# Patient Record
Sex: Male | Born: 1965 | Race: White | Hispanic: No | Marital: Single | Smoking: Former smoker
Health system: Southern US, Community
[De-identification: ages and names within clinical notes are randomized; demographics above are authoritative.]

## PROBLEM LIST (undated history)

## (undated) DIAGNOSIS — Z789 Other specified health status: Secondary | ICD-10-CM

## (undated) HISTORY — PX: KNEE SURGERY: SHX244

## (undated) HISTORY — PX: OTHER SURGICAL HISTORY: SHX169

---

## 2001-04-13 ENCOUNTER — Encounter: Admission: RE | Admit: 2001-04-13 | Discharge: 2001-04-13 | Payer: Self-pay | Admitting: Endocrinology

## 2001-04-13 ENCOUNTER — Encounter: Payer: Self-pay | Admitting: Endocrinology

## 2001-06-15 ENCOUNTER — Ambulatory Visit (HOSPITAL_BASED_OUTPATIENT_CLINIC_OR_DEPARTMENT_OTHER): Admission: RE | Admit: 2001-06-15 | Discharge: 2001-06-15 | Payer: Self-pay | Admitting: Urology

## 2003-10-19 ENCOUNTER — Emergency Department (HOSPITAL_COMMUNITY): Admission: EM | Admit: 2003-10-19 | Discharge: 2003-10-19 | Payer: Self-pay | Admitting: Emergency Medicine

## 2003-10-20 ENCOUNTER — Ambulatory Visit (HOSPITAL_COMMUNITY): Admission: RE | Admit: 2003-10-20 | Discharge: 2003-10-20 | Payer: Self-pay | Admitting: Orthopedic Surgery

## 2005-07-03 ENCOUNTER — Emergency Department (HOSPITAL_COMMUNITY): Admission: EM | Admit: 2005-07-03 | Discharge: 2005-07-03 | Payer: Self-pay | Admitting: Family Medicine

## 2011-07-06 ENCOUNTER — Emergency Department (INDEPENDENT_AMBULATORY_CARE_PROVIDER_SITE_OTHER): Payer: BC Managed Care – PPO

## 2011-07-06 ENCOUNTER — Encounter (HOSPITAL_COMMUNITY): Payer: Self-pay

## 2011-07-06 ENCOUNTER — Emergency Department (INDEPENDENT_AMBULATORY_CARE_PROVIDER_SITE_OTHER)
Admission: EM | Admit: 2011-07-06 | Discharge: 2011-07-06 | Disposition: A | Discharge status: Home / Self Care | Payer: BC Managed Care – PPO | Source: Home / Self Care | Attending: Emergency Medicine | Admitting: Emergency Medicine

## 2011-07-06 DIAGNOSIS — J40 Bronchitis, not specified as acute or chronic: Secondary | ICD-10-CM

## 2011-07-06 MED ORDER — ALBUTEROL SULFATE HFA 108 (90 BASE) MCG/ACT IN AERS: 1.0000 | INHALATION_SPRAY | Freq: Four times a day (QID) | RESPIRATORY_TRACT | Status: DC | PRN

## 2011-07-06 MED ORDER — NAPROXEN 500 MG PO TABS: 500.0000 mg | ORAL_TABLET | Freq: Two times a day (BID) | ORAL | Status: AC

## 2011-07-06 MED ORDER — DEXAMETHASONE 4 MG PO TABS: ORAL_TABLET | ORAL | Status: AC

## 2011-07-06 NOTE — ED Notes (Signed)
Pt has lt sided chest pain that he describes as "stabbing pain when taking a deep breath"

## 2011-07-06 NOTE — Discharge Instructions (Signed)
Take two puffs from your albuterol inhaler every 4 hours. Finish the steroids unless your doctor tells you to stop. u may decrease the frequency of your albuterol inhaler as the numbers go up and you start feeling better. You may take tylenol 1 gram up to 4 times a day as needed for pain. This with 600 mg of motrin is an effective combination for pain and fever. Make sure you drink extra fluids. You need to find a primary care physician for routine health care. Return if you get worse, have a fever >100.4, or any other concerns.   Go to www.goodrx.com to look up your medications. This will give you a list of where you can find your prescriptions at the most affordable prices.

## 2011-07-06 NOTE — ED Provider Notes (Addendum)
History     CSN: [id number]  Arrival date & time 07/06/11  1840   First MD Initiated Contact with Patient 07/06/11 1928      Chief Complaint  Patient presents with  . Pleurisy    (Consider location/radiation/quality/duration/timing/severity/associated sxs/prior treatment) HPI Comments: Patient with sharp, intermittent, nonradiating, left-sided chest pain for the past week. Reports some shortness of breath when he was hiking yesterday. No positional component. No nausea, vomiting, wheezing. No hemoptysis, no unintentional weight loss. No leg pain, swelling, recent prolonged immobilization, surgery in the past 4 weeks, exogenous estrogen, history of DVT or PE, history of cancer. Patient does admit to using  anabolic steroids. States he has been working out extensively for a bodybuilding competition. States that he has had a "cold" for the past 3 weeks. States he used to get pain like this frequently when he was smoking. States he quit several years ago.  ROS as noted in HPI. All other ROS negative.   Patient is a 46 y.o. male presenting with chest pain. The history is provided by the patient. No language interpreter was used.  Chest Pain The chest pain began 5 - 7 days ago. Chest pain occurs intermittently. The chest pain is unchanged. The pain is associated with breathing. The quality of the pain is described as sharp. The pain does not radiate. Primary symptoms include shortness of breath and cough. Pertinent negatives for primary symptoms include no fever, no syncope, no wheezing, no palpitations, no abdominal pain, no nausea and no vomiting.  Pertinent negatives for associated symptoms include no diaphoresis and no lower extremity edema. He tried nothing for the symptoms.  Pertinent negatives for past medical history include no DVT, no hypertension and no PE.     History reviewed. No pertinent past medical history.  History reviewed. No pertinent past surgical history.  History  reviewed. No pertinent family history.  History  Substance Use Topics  . Smoking status: Former Games developer  . Smokeless tobacco: Not on file  . Alcohol Use: No      Review of Systems  Constitutional: Negative for fever and diaphoresis.  Respiratory: Positive for cough and shortness of breath. Negative for wheezing.   Cardiovascular: Positive for chest pain. Negative for palpitations and syncope.  Gastrointestinal: Negative for nausea, vomiting and abdominal pain.    Allergies  Review of patient's allergies indicates no known allergies.  Home Medications  No current outpatient prescriptions on file.  BP 144/82  Pulse 96  Temp(Src) 99.1 F (37.3 C) (Oral)  Resp 18  SpO2 96%  Physical Exam  Nursing note and vitals reviewed. Constitutional: He is oriented to person, place, and time. He appears well-developed and well-nourished.  HENT:  Head: Normocephalic and atraumatic.  Eyes: Conjunctivae and EOM are normal. Pupils are equal, round, and reactive to light.  Neck: Normal range of motion.  Cardiovascular: Normal rate, regular rhythm, normal heart sounds and intact distal pulses.   Pulmonary/Chest: Effort normal and breath sounds normal. No respiratory distress. He has no wheezes. He has no rales. He exhibits no tenderness.  Abdominal: Soft. Bowel sounds are normal. He exhibits no distension. There is no tenderness.  Musculoskeletal: Normal range of motion. He exhibits no edema and no tenderness.  Neurological: He is alert and oriented to person, place, and time.  Skin: Skin is warm and dry. No rash noted.  Psychiatric: He has a normal mood and affect. His behavior is normal.    ED Course  Procedures (including critical care time)  Labs Reviewed - No data to display Dg Chest 2 View  07/06/2011  *RADIOLOGY REPORT*  Clinical Data: Left chest pain with deep inspiration for 1 week. Former smoker.  CHEST - 2 VIEW  Comparison: None.  Findings: The heart size and mediastinal  contours are normal.  The lungs do appear mildly hyperinflated but clear.  There is no pleural effusion or pneumothorax.  No fractures are identified.  IMPRESSION: No acute cardiopulmonary process.  Suspected mild obstructive lung disease.  Original Report Authenticated By: Gerrianne Scale, M.D.     1. Bronchitis    EKG: Normal sinus rhythm, rate 79. Normal axis, normal intervals. No hypertrophy. No ST-T wave changes. No previous EKG for comparison.  MDM  Previous records reviewed, noncontributory. H&P most consistent with a bronchitis.  No evidence of pneumonia on x-ray, or EKG changes suggestive of chest ischemia. Sent home with 2 days of Decadron, albuterol, high-dose NSAIDs. Discussed dangers of using anabolic steroids. Patient states that he's not used any in the past 2 weeks. Advised patient to not restart. Provided referrals for primary care.  Luiz Blare, MD 07/07/11 3086  Luiz Blare, MD 07/07/11 [phone]

## 2011-09-08 ENCOUNTER — Other Ambulatory Visit: Payer: BC Managed Care – PPO | Admitting: Internal Medicine

## 2011-09-09 ENCOUNTER — Encounter: Payer: BC Managed Care – PPO | Admitting: Internal Medicine

## 2011-10-30 ENCOUNTER — Other Ambulatory Visit: Payer: BC Managed Care – PPO | Admitting: Internal Medicine

## 2011-10-31 ENCOUNTER — Encounter: Payer: Self-pay | Admitting: Internal Medicine

## 2011-10-31 ENCOUNTER — Ambulatory Visit (INDEPENDENT_AMBULATORY_CARE_PROVIDER_SITE_OTHER): Payer: BC Managed Care – PPO | Admitting: Internal Medicine

## 2011-10-31 ENCOUNTER — Other Ambulatory Visit: Payer: BC Managed Care – PPO | Admitting: Internal Medicine

## 2011-10-31 VITALS — BP 114/66 | HR 72 | Temp 98.0°F | Ht 70.5 in | Wt 170.5 lb

## 2011-10-31 DIAGNOSIS — Z0000 Encounter for general adult medical examination without abnormal findings: Secondary | ICD-10-CM

## 2011-10-31 DIAGNOSIS — K429 Umbilical hernia without obstruction or gangrene: Secondary | ICD-10-CM

## 2011-10-31 DIAGNOSIS — Z23 Encounter for immunization: Secondary | ICD-10-CM

## 2011-10-31 LAB — CBC WITH DIFFERENTIAL/PLATELET
Basophils Relative: 1 % (ref 0–1)
Eosinophils Absolute: 0.1 10*3/uL (ref 0.0–0.7)
HCT: 44.1 % (ref 39.0–52.0)
Hemoglobin: 14.9 g/dL (ref 13.0–17.0)
Lymphs Abs: 1.8 10*3/uL (ref 0.7–4.0)
MCH: 32 pg (ref 26.0–34.0)
MCHC: 33.8 g/dL (ref 30.0–36.0)
MCV: 94.8 fL (ref 78.0–100.0)
Monocytes Absolute: 0.6 10*3/uL (ref 0.1–1.0)
Monocytes Relative: 12 % (ref 3–12)
Neutrophils Relative %: 52 % (ref 43–77)
RBC: 4.65 MIL/uL (ref 4.22–5.81)

## 2011-10-31 LAB — COMPREHENSIVE METABOLIC PANEL
Albumin: 4 g/dL (ref 3.5–5.2)
BUN: 22 mg/dL (ref 6–23)
CO2: 30 mEq/L (ref 19–32)
Glucose, Bld: 74 mg/dL (ref 70–99)
Sodium: 140 mEq/L (ref 135–145)
Total Bilirubin: 0.5 mg/dL (ref 0.3–1.2)
Total Protein: 6.3 g/dL (ref 6.0–8.3)

## 2011-10-31 LAB — LIPID PANEL
Cholesterol: 156 mg/dL (ref 0–200)
HDL: 49 mg/dL (ref >39)
Triglycerides: 71 mg/dL (ref <150)
VLDL: 14 mg/dL (ref 0–40)

## 2011-10-31 LAB — POCT URINALYSIS DIPSTICK
Bilirubin, UA: NEGATIVE
Blood, UA: NEGATIVE
Glucose, UA: NEGATIVE
Spec Grav, UA: 1.015
pH, UA: 6.5

## 2011-10-31 MED ORDER — TETANUS-DIPHTH-ACELL PERTUSSIS 5-2.5-18.5 LF-MCG/0.5 IM SUSP: 0.5000 mL | Freq: Once | INTRAMUSCULAR | Status: AC

## 2011-11-01 DIAGNOSIS — K429 Umbilical hernia without obstruction or gangrene: Secondary | ICD-10-CM | POA: Insufficient documentation

## 2011-11-01 NOTE — Progress Notes (Signed)
  Subjective:    Patient ID: Austin Burke, male    DOB: [birth year], 46 y.o.   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 He is also training for a physique contest to be held in November at [employer]. No history of serious illnesses, accidents, hospitalizations, operations. No known drug allergies. No chronic medications. Patient not sure when he last had a tetanus immunization so that they was given today. He is divorced. Currently does not smoke. Quit smoking about 6 months ago for the second time. At that time was smoking about a half pack per day and had smoked that amount for 10 years. Prior to that he had smoked for 5 years about the same amount. He does weights and works out.  Social history: Does not smoke alcohol and is divorced with no children  Family history: Father age 3 with history of COPD and hypertension. Mother living age 73 in good health. 2 brothers ages 6 and 77 in good health.    Review of Systems  Constitutional: Negative.   HENT: Negative.   Eyes: Negative.   Respiratory: Negative.   Cardiovascular: Negative.   Gastrointestinal: Negative.   Genitourinary: Negative.   Musculoskeletal: Negative.   Neurological: Negative.   Hematological: Negative.   Psychiatric/Behavioral: Negative.        Objective:   Physical Exam  Nursing note and vitals reviewed. Constitutional: He is oriented to person, place, and time. He appears well-developed and well-nourished. No distress.  HENT:  Head: Normocephalic and atraumatic.  Right Ear: External ear normal.  Left Ear: External ear normal.  Mouth/Throat: Oropharynx is clear and moist. No oropharyngeal exudate.  Eyes: Conjunctivae and EOM are normal. Pupils are equal, round, and reactive to light. Right eye exhibits no discharge. Left eye exhibits no discharge. No scleral icterus.  Neck: Neck supple. No JVD present.    Cardiovascular: Normal rate, regular rhythm, normal heart sounds and intact distal pulses.   No murmur heard. Pulmonary/Chest: Effort normal and breath sounds normal. No respiratory distress. He has no wheezes. He has no rales.  Abdominal: Soft. Bowel sounds are normal. He exhibits no distension and no mass. There is no tenderness. There is no rebound and no guarding.       Small umbilical hernia that is reducible  Genitourinary: Prostate normal and penis normal.       No hernias to direct palpation  Lymphadenopathy:    He has no cervical adenopathy.  Neurological: He is alert and oriented to person, place, and time. He has normal reflexes. No cranial nerve deficit. Coordination normal.  Skin: Skin is warm and dry. He is not diaphoretic.       Tatto right upper arm  Psychiatric: He has a normal mood and affect. His behavior is normal. Judgment and thought content normal.          Assessment & Plan:  Small reducible umbilical hernia  Plan: Fasting lab studies drawn and are pending. Results will be reviewed and mailed to him with comments. I am glad he has quit smoking. Return one year or as needed. Tdap given today. Addendum: 11/01/2011 lab  studies reviewed and are within normal limits.

## 2011-11-01 NOTE — Patient Instructions (Addendum)
Tetanus immunization update given today good for 10 years. Return in one year or as needed.

## 2012-03-24 DIAGNOSIS — K648 Other hemorrhoids: Secondary | ICD-10-CM

## 2012-03-24 HISTORY — DX: Other hemorrhoids: K64.8

## 2012-03-31 ENCOUNTER — Encounter (HOSPITAL_COMMUNITY): Payer: Self-pay | Admitting: *Deleted

## 2012-03-31 ENCOUNTER — Emergency Department (INDEPENDENT_AMBULATORY_CARE_PROVIDER_SITE_OTHER)
Admission: EM | Admit: 2012-03-31 | Discharge: 2012-03-31 | Disposition: A | Discharge status: Home / Self Care | Payer: BC Managed Care – PPO | Source: Home / Self Care | Attending: Emergency Medicine | Admitting: Emergency Medicine

## 2012-03-31 DIAGNOSIS — L03019 Cellulitis of unspecified finger: Secondary | ICD-10-CM

## 2012-03-31 MED ORDER — IBUPROFEN 800 MG PO TABS
ORAL_TABLET | ORAL | Status: AC
Start: 2012-03-31 — End: 2012-03-31
  Filled 2012-03-31: qty 1

## 2012-03-31 MED ORDER — AMOXICILLIN-POT CLAVULANATE 500-125 MG PO TABS: 1.0000 | ORAL_TABLET | Freq: Two times a day (BID) | ORAL | Status: AC

## 2012-03-31 MED ORDER — IBUPROFEN 800 MG PO TABS
800.0000 mg | ORAL_TABLET | Freq: Once | ORAL | Status: AC
  Administered 2012-03-31: 800 mg via ORAL

## 2012-03-31 NOTE — ED Notes (Signed)
Redness, tenderness and swelling around nail of right middle finger onset 2-3 days ago.  Started turning purple today.

## 2012-03-31 NOTE — ED Provider Notes (Signed)
History     CSN: [id number]  Arrival date & time 03/31/12  1514   First MD Initiated Contact with Patient 03/31/12 [phone]      Chief Complaint  Patient presents with  . Wound Infection    (Consider location/radiation/quality/duration/timing/severity/associated sxs/prior treatment) HPI Comments: For the last 2-3 days patient (redness tenderness and swelling around the nail of the right middle finger. Last night he poked at it with a sterile needle and only obtained scars amount of bloody discharge. Today area seemed to be tender at touch better and swollen. Patient denies any injury or trauma to the area. No fevers, chills numbness or tingling distally to the area of infection.  Patient is a 47 y.o. male presenting with hand pain. The history is provided by the patient.  Hand Pain This is a new problem. The problem occurs every several days. The problem has not changed since onset.Exacerbated by: palpation. He has tried nothing for the symptoms.    History reviewed. No pertinent past medical history.  Past Surgical History  Procedure Date  . Knee surgery     left in 1987    Family History  Problem Relation Age of Onset  . COPD Father     History  Substance Use Topics  . Smoking status: Former Smoker    Quit date: 05/02/2011  . Smokeless tobacco: Not on file  . Alcohol Use: No     Comment: rarely      Review of Systems  Constitutional: Negative for chills and fatigue.  Skin: Positive for wound.    Allergies  Review of patient's allergies indicates no known allergies.  Home Medications   Current Outpatient Rx  Name  Route  Sig  Dispense  Refill  . ONE-DAILY MULTI VITAMINS PO TABS   Oral   Take 1 tablet by mouth daily.         . ALBUTEROL SULFATE HFA 108 (90 BASE) MCG/ACT IN AERS   Inhalation   Inhale 1-2 puffs into the lungs every 6 (six) hours as needed for wheezing. Dispense with aerochamber   1 Inhaler   0   . AMOXICILLIN-POT CLAVULANATE 500-125 MG  PO TABS   Oral   Take 1 tablet (500 mg total) by mouth 2 (two) times daily.   14 tablet   0   . NAPROXEN 500 MG PO TABS   Oral   Take 1 tablet (500 mg total) by mouth 2 (two) times daily.   20 tablet   0     BP 155/83  Pulse 80  Temp 98.7 F (37.1 C) (Oral)  Resp 18  SpO2 98%  Physical Exam  Constitutional: He appears well-developed. No distress.  HENT:  Head: Normocephalic.  Musculoskeletal: He exhibits tenderness.       Hands: Neurological: He is alert.  Skin: No rash noted. There is erythema.    ED Course  Procedures (including critical care time)  Labs Reviewed - No data to display No results found.   1. Paronychia of finger       MDM  Mild early R middle finger paronychia. Puncture obtaining bloody discharge. Patient was encouraged to soak affected finger in soapy water several times several cycles. He was also instructed in guided to use antibiotic only if no improvement in the next 24-48 hours. Patient agrees with treatment plan and followup care as discussed.        Jimmie Molly, MD 03/31/12 [phone]

## 2012-09-07 ENCOUNTER — Ambulatory Visit (INDEPENDENT_AMBULATORY_CARE_PROVIDER_SITE_OTHER): Payer: BC Managed Care – PPO | Admitting: Internal Medicine

## 2012-09-07 ENCOUNTER — Telehealth: Payer: Self-pay | Admitting: Internal Medicine

## 2012-09-07 VITALS — BP 130/74 | HR 72 | Wt 172.5 lb

## 2012-09-07 DIAGNOSIS — K648 Other hemorrhoids: Secondary | ICD-10-CM

## 2012-09-07 NOTE — Telephone Encounter (Signed)
See at 2 pm today

## 2012-09-07 NOTE — Telephone Encounter (Signed)
I did not see Dr. Beryle Quant note in time to bring patient in at 2pm today.  Requested to bring patient at 4:15.  Dr. Lenord Fellers ok with that.  LMOM for patient on his contact # to please call me ASAP to confirm appt today at 4:15 as Dr. Lenord Fellers will be going out of town for the remainder of the week.

## 2012-09-19 ENCOUNTER — Encounter: Payer: Self-pay | Admitting: Internal Medicine

## 2012-09-19 DIAGNOSIS — K648 Other hemorrhoids: Secondary | ICD-10-CM | POA: Insufficient documentation

## 2012-09-19 NOTE — Patient Instructions (Addendum)
Use Anusol rectal suppositories twice daily. Call if symptoms persist. Use ProctoCream-HC in perirectal area 4 times a day

## 2012-09-19 NOTE — Progress Notes (Signed)
  Subjective:    Patient ID: Austin Burke, male    DOB: Feb 23, 1966, 47 y.o.   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 Takes no chronic medications. No history of serious illnesses accidents hospitalizations or operations. No known drug allergies. Quit smoking in early 2013. That was the second time he quit. At that time he was smoking about a half pack per day and has smoked for about 10 years. Prior to that he smoked for 5 years about a half pack per day. He does lift weights and works out.  Social history: Does not consume alcohol. He is divorced with no children.  Family history: Father age 4 with history of COPD and hypertension. Mother in good health. 2 brothers in good health.  Patient has been having issues with rectal pressure and pain. No frank bleeding. Feels that he has a recurrence of hemorrhoids. Says he had this same issue several years ago. He's been using some over-the-counter suppositories with very little relief. Denies significant constipation or diarrhea.    Review of Systems history of small umbilical hernia that is reducible. Tattoo right upper arm.     Objective:   Physical Exam Abdomen: No hepatosplenomegaly masses or tenderness. Rectal exam no masses palpable. Appears to have tight anal sphincter. Anoscopy shows internal hemorrhoids that are friable. No significant external hemorrhoids.        Assessment & Plan:  Internal hemorrhoids  Plan: Anusol HC rectal suppositories to use twice daily. May use Proctofoam HC in the perirectal area 4 times a day. Needs to keep stool soft and may need stool softener for several days. Call if not better in one week or sooner if worse.

## 2014-05-05 ENCOUNTER — Emergency Department (HOSPITAL_COMMUNITY)
Admission: EM | Admit: 2014-05-05 | Discharge: 2014-05-05 | Disposition: A | Discharge status: Home / Self Care | Payer: Self-pay | Attending: Emergency Medicine | Admitting: Emergency Medicine

## 2014-05-05 ENCOUNTER — Emergency Department (HOSPITAL_COMMUNITY): Payer: Self-pay

## 2014-05-05 ENCOUNTER — Encounter (HOSPITAL_COMMUNITY): Payer: Self-pay | Admitting: *Deleted

## 2014-05-05 DIAGNOSIS — X58XXXA Exposure to other specified factors, initial encounter: Secondary | ICD-10-CM | POA: Insufficient documentation

## 2014-05-05 DIAGNOSIS — Z79899 Other long term (current) drug therapy: Secondary | ICD-10-CM | POA: Insufficient documentation

## 2014-05-05 DIAGNOSIS — M6620 Spontaneous rupture of extensor tendons, unspecified site: Secondary | ICD-10-CM

## 2014-05-05 DIAGNOSIS — Z87891 Personal history of nicotine dependence: Secondary | ICD-10-CM | POA: Insufficient documentation

## 2014-05-05 DIAGNOSIS — Y998 Other external cause status: Secondary | ICD-10-CM | POA: Insufficient documentation

## 2014-05-05 DIAGNOSIS — M669 Spontaneous rupture of unspecified tendon: Secondary | ICD-10-CM

## 2014-05-05 DIAGNOSIS — Y9389 Activity, other specified: Secondary | ICD-10-CM | POA: Insufficient documentation

## 2014-05-05 DIAGNOSIS — M6628 Spontaneous rupture of extensor tendons, other site: Secondary | ICD-10-CM | POA: Insufficient documentation

## 2014-05-05 DIAGNOSIS — Y9289 Other specified places as the place of occurrence of the external cause: Secondary | ICD-10-CM | POA: Insufficient documentation

## 2014-05-05 NOTE — ED Provider Notes (Signed)
CSN: [id number]     Arrival date & time 05/05/14  2130 History  This chart was scribed for non-physician practitioner, Austin Forth, PA-C, working with Austin Porter, MD, by Austin Burke, ED Scribe. This patient was seen in room TR04C/TR04C and the patient's care was started at 10:06 PM.   Chief Complaint  Patient presents with  . Finger Injury    The history is provided by the patient and medical records. No language interpreter was used.     HPI Comments: Austin Burke is a 49 y.o. male, with no significant medical history, who presents to the Emergency Department complaining of right ring finger injury that occurred PTA. Patient states he felt a pop in his right ring finger as he pushed himself out of bed. He reports the tip of his finger is permanently flexed and states he is unable to straighten it. There is associated cramping pain to the finger. Patient states he tried to twist and turn the tip of his finger in an attempt to get his finger to straighten and "pop" back in place without success. He denies any other injuries.   History reviewed. No pertinent past medical history. Past Surgical History  Procedure Laterality Date  . Knee surgery      left in 1987   Family History  Problem Relation Age of Onset  . COPD Father    History  Substance Use Topics  . Smoking status: Former Smoker    Quit date: 05/02/2011  . Smokeless tobacco: Not on file  . Alcohol Use: No     Comment: rarely    Review of Systems  Constitutional: Negative for fever and chills.  Gastrointestinal: Negative for nausea and vomiting.  Musculoskeletal: Positive for myalgias and arthralgias. Negative for back pain, joint swelling, neck pain and neck stiffness.  Skin: Negative for wound.  Neurological: Negative for weakness and numbness.  Hematological: Does not bruise/bleed easily.  Psychiatric/Behavioral: The patient is not nervous/anxious.   All other systems reviewed and are  negative.     Allergies  Review of patient's allergies indicates no known allergies.  Home Medications   Prior to Admission medications   Medication Sig Start Date End Date Taking? Authorizing Provider  albuterol (PROVENTIL HFA;VENTOLIN HFA) 108 (90 BASE) MCG/ACT inhaler Inhale 1-2 puffs into the lungs every 6 (six) hours as needed for wheezing. Dispense with aerochamber Patient not taking: Reported on 05/05/2014 07/06/11 07/05/12  Austin Gong, MD  Multiple Vitamin (MULTIVITAMIN) tablet Take 1 tablet by mouth daily.    Historical Provider, MD   Triage Vitals: BP 136/69 mmHg  Pulse 95  Temp(Src) 98 F (36.7 C) (Oral)  Resp 18  SpO2 96%  Physical Exam  Constitutional: He appears well-developed and well-nourished. No distress.  HENT:  Head: Normocephalic and atraumatic.  Eyes: Conjunctivae are normal.  Neck: Normal range of motion.  Cardiovascular: Normal rate, regular rhythm and intact distal pulses.   Capillary refill < 3 sec  Pulmonary/Chest: Effort normal and breath sounds normal.  Musculoskeletal: He exhibits tenderness. He exhibits no edema.  ROM: Full ROM of the MCP and PIP of the right ring finger. Full flexion, but no extension of the DIP of the right ring finger. Right ring finger is in permanent flexion at the DIP. No TTP of the right fingers or hand. Full passive ROM of the MCP, PIP, and DIP of the right ring finger. Positive Elson's test of the right ring finger  Neurological: He is alert. Coordination normal.  Sensation intact  to dull and sharp throughout the entirety of the right hand. Strength 5/5 strength at the MCP and PIP, 5/5 strength with flexion at the DIP, and 0/5 with extension of the DIP.  Skin: Skin is warm and dry. He is not diaphoretic.  No tenting of the skin  Psychiatric: He has a normal mood and affect.  Nursing note and vitals reviewed.   ED Course  Procedures (including critical care time)  DIAGNOSTIC STUDIES: Oxygen Saturation is 96%  on room air, adequate by my interpretation.    COORDINATION OF CARE: At 2210 Discussed treatment plan with patient which includes imaging. Patient agrees.   Labs Review Labs Reviewed - No data to display  Imaging Review Dg Finger Ring Right  05/05/2014   CLINICAL DATA:  FINGER INJURY with pain.   Initial encounter.  EXAM: RIGHT RING FINGER 2+V  COMPARISON:  None.  FINDINGS: 3 views right ring finger. There is no evidence of fracture or dislocation. There is no evidence of arthropathy or other focal bone abnormality. Soft tissues are unremarkable.  IMPRESSION: Negative.   Electronically Signed   By: Odessa Burke  Burke M.D.   On: 05/05/2014 22:51     EKG Interpretation None      MDM   Final diagnoses:  Nontraumatic extensor tendon rupture   Austin ShutterBrian P Burke presents with right ringer finger flexion of the DIP.  Exam with positive Ellison's test and is consistent with right extensor tendon rupture.  No laceration to the hand. Strength 5/5 in all other joints of the right hand. Sensation intact throughout.  X-ray without evidence of joint dislocation. Finger splinted in full extension and patient will be referred to hand surgery for repair.  I have personally reviewed patient's vitals, nursing note and any pertinent labs or imaging.  I performed an focused physical exam; undressed when appropriate .    It has been determined that no acute conditions requiring further emergency intervention are present at this time. The patient/guardian have been advised of the diagnosis and plan. I reviewed any labs and imaging including any potential incidental findings. We have discussed signs and symptoms that warrant return to the ED and they are listed in the discharge instructions.    Vital signs are stable at discharge.   BP 139/79 mmHg  Pulse 82  Temp(Src) 98.2 F (36.8 C) (Oral)  Resp 15  SpO2 97%  I personally performed the services described in this documentation, which was scribed in my presence.  The recorded information has been reviewed and is accurate.   Austin ClientHannah Mikyle Sox, PA-C 05/05/14 2322  Austin PorterMark James, MD 05/10/14 [phone]

## 2014-05-05 NOTE — Discharge Instructions (Signed)
1. Medications: ibuprofen as needed for pain, usual home medications 2. Treatment: rest, drink plenty of fluids, wear splint until seen by Hand surgery 3. Follow Up: Please followup with your primary doctor in 3 days for discussion of your diagnoses and further evaluation after today's visit; if you do not have a primary care doctor use the resource guide provided to find one; Please return to the ER for numbness or worsening symptoms    Tendon Injury Tendons are strong, cordlike structures that connect muscle to bone. Tendons are made up of woven fibers, like a rope. A tendon injury is a tear (rupture) of the tendon. The rupture may be partial (only a few of the fibers in your tendon rupture) or complete (your entire tendon ruptures). CAUSES  Tendon injuries can be caused by high-stress activities, such as sports. They also can be caused by a repetitive injury or by a single injury from an excessive, rapid force. SYMPTOMS  Symptoms of tendon injury include pain when you move the joint close to the tendon. Other symptoms are swelling, redness, and warmth. DIAGNOSIS  Tendon injuries often can be diagnosed by physical exam. However, sometimes an X-ray exam or advanced imaging, such as magnetic resonance imaging (MRI), is necessary to determine the extent of the injury. TREATMENT  Partial tendon ruptures often can be treated with immobilization. A splint, bandage, or removable brace usually is used to immobilize the injured tendon. Most injured tendons need to be immobilized for 1-2 months before they are completely healed. Complete tendon ruptures may require surgical reattachment. Document Released: 04/17/2004 Document Revised: 02/27/2011 Document Reviewed: 06/01/2011 [facility] Patient Information 2015 [employer], [place]. This information is not intended to replace advice given to you by your health care provider. Make sure you discuss any questions you have with your health care provider.

## 2014-05-05 NOTE — ED Notes (Signed)
Pt to radiology.

## 2014-05-05 NOTE — ED Notes (Signed)
The pt has a rt  Distal tip of his ring finger that is flexed.  He was getting out of bed pressed his hand on the bed.  He can straighten the finger but it flkexes back again

## 2018-05-13 ENCOUNTER — Encounter: Payer: Self-pay | Admitting: Internal Medicine

## 2018-05-13 ENCOUNTER — Ambulatory Visit (INDEPENDENT_AMBULATORY_CARE_PROVIDER_SITE_OTHER): Payer: 59 | Admitting: Internal Medicine

## 2018-05-13 VITALS — BP 120/90 | HR 90 | Temp 98.5°F | Ht 70.5 in | Wt 158.0 lb

## 2018-05-13 DIAGNOSIS — J029 Acute pharyngitis, unspecified: Secondary | ICD-10-CM

## 2018-05-13 LAB — POCT RAPID STREP A (OFFICE): RAPID STREP A SCREEN: NEGATIVE

## 2018-05-13 MED ORDER — AMOXICILLIN 500 MG PO CAPS: 500.0000 mg | ORAL_CAPSULE | Freq: Three times a day (TID) | ORAL | 0 refills | Status: DC

## 2018-05-13 NOTE — Patient Instructions (Addendum)
Amoxicillin 500 mg 3 times a day x 10 days. Advil for pain. Call if not better in 5-7 days or sooner if worse

## 2018-05-13 NOTE — Progress Notes (Signed)
   Subjective:    Patient ID: Austin Burke, male    DOB: 01-May-1965, 53 y.o.   MRN: [id number]  HPI 53 year old Male not seen since 2014.  Came to office without an appointment.  We worked him and today.  Has headache and sore throat but no other symptoms. Has malaise and fatigue. No fever ot chills.  Says his health is been good over the past few years.  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  Review of Systems see above     Objective:   Physical Exam Vitals signs reviewed.  Constitutional:      Appearance: He is well-developed.  HENT:     Head: Normocephalic and atraumatic.     Right Ear: Tympanic membrane and ear canal normal. No middle ear effusion. Tympanic membrane is not erythematous.     Left Ear: Tympanic membrane and ear canal normal.  No middle ear effusion. Tympanic membrane is not erythematous.     Nose: No rhinorrhea.     Mouth/Throat:     Mouth: Mucous membranes are moist.     Pharynx: No oropharyngeal exudate.     Tonsils: No tonsillar exudate or tonsillar abscesses.     Comments: Pharynx very slightly injected Eyes:     Conjunctiva/sclera: Conjunctivae normal.  Neck:     Musculoskeletal: Neck supple.     Thyroid: No thyromegaly.  Pulmonary:     Effort: Pulmonary effort is normal.     Breath sounds: Normal breath sounds. No wheezing or rales.  Lymphadenopathy:     Cervical: No cervical adenopathy.  Skin:    General: Skin is warm and dry.  Neurological:     Mental Status: He is alert.  Psychiatric:        Mood and Affect: Mood normal.           Assessment & Plan:  Acute pharyngitis-rapid strep screen is negative  Myalgias and headache   Plan: Amoxicillin 500 mg 3 times a day x 10 days. Tylenol or Advil for pain.  Rest and drink plenty of fluids.

## 2019-08-18 ENCOUNTER — Observation Stay (HOSPITAL_BASED_OUTPATIENT_CLINIC_OR_DEPARTMENT_OTHER)
Admission: EM | Admit: 2019-08-18 | Discharge: 2019-08-19 | Disposition: A | Discharge status: Home / Self Care | Payer: 59 | Attending: Surgery | Admitting: Surgery

## 2019-08-18 ENCOUNTER — Emergency Department (HOSPITAL_BASED_OUTPATIENT_CLINIC_OR_DEPARTMENT_OTHER): Payer: 59

## 2019-08-18 ENCOUNTER — Encounter (HOSPITAL_BASED_OUTPATIENT_CLINIC_OR_DEPARTMENT_OTHER): Payer: Self-pay

## 2019-08-18 ENCOUNTER — Emergency Department (HOSPITAL_COMMUNITY): Payer: 59 | Admitting: Registered Nurse

## 2019-08-18 ENCOUNTER — Ambulatory Visit (INDEPENDENT_AMBULATORY_CARE_PROVIDER_SITE_OTHER)
Admission: EM | Admit: 2019-08-18 | Discharge: 2019-08-18 | Disposition: A | Discharge status: Home / Self Care | Payer: 59 | Source: Home / Self Care

## 2019-08-18 ENCOUNTER — Encounter (HOSPITAL_COMMUNITY)
Admission: EM | Disposition: A | Discharge status: Home / Self Care | Payer: Self-pay | Source: Home / Self Care | Attending: Emergency Medicine

## 2019-08-18 ENCOUNTER — Other Ambulatory Visit: Payer: Self-pay

## 2019-08-18 ENCOUNTER — Encounter: Payer: Self-pay | Admitting: Emergency Medicine

## 2019-08-18 DIAGNOSIS — Z87891 Personal history of nicotine dependence: Secondary | ICD-10-CM | POA: Diagnosis not present

## 2019-08-18 DIAGNOSIS — R1011 Right upper quadrant pain: Secondary | ICD-10-CM

## 2019-08-18 DIAGNOSIS — K353 Acute appendicitis with localized peritonitis, without perforation or gangrene: Principal | ICD-10-CM | POA: Insufficient documentation

## 2019-08-18 DIAGNOSIS — R1031 Right lower quadrant pain: Secondary | ICD-10-CM | POA: Insufficient documentation

## 2019-08-18 DIAGNOSIS — Z20822 Contact with and (suspected) exposure to covid-19: Secondary | ICD-10-CM | POA: Insufficient documentation

## 2019-08-18 DIAGNOSIS — K37 Unspecified appendicitis: Secondary | ICD-10-CM | POA: Diagnosis present

## 2019-08-18 HISTORY — PX: LAPAROSCOPIC APPENDECTOMY: SHX408

## 2019-08-18 HISTORY — DX: Other specified health status: Z78.9

## 2019-08-18 LAB — COMPREHENSIVE METABOLIC PANEL
ALT: 15 U/L (ref 0–44)
AST: 15 U/L (ref 15–41)
Albumin: 4.8 g/dL (ref 3.5–5.0)
Alkaline Phosphatase: 95 U/L (ref 38–126)
Anion gap: 11 (ref 5–15)
BUN: 9 mg/dL (ref 6–20)
CO2: 25 mmol/L (ref 22–32)
Calcium: 9.2 mg/dL (ref 8.9–10.3)
Chloride: 100 mmol/L (ref 98–111)
Creatinine, Ser: 0.92 mg/dL (ref 0.61–1.24)
GFR calc Af Amer: >60 mL/min (ref >60)
GFR calc non Af Amer: >60 mL/min (ref >60)
Glucose, Bld: 114 mg/dL — ABNORMAL HIGH (ref 70–99)
Potassium: 3.9 mmol/L (ref 3.5–5.1)
Sodium: 136 mmol/L (ref 135–145)
Total Bilirubin: 0.9 mg/dL (ref 0.3–1.2)
Total Protein: 7.9 g/dL (ref 6.5–8.1)

## 2019-08-18 LAB — CBC WITH DIFFERENTIAL/PLATELET
Abs Immature Granulocytes: 0.07 10*3/uL (ref 0.00–0.07)
Basophils Absolute: 0.1 10*3/uL (ref 0.0–0.1)
Basophils Relative: 0 %
Eosinophils Absolute: 0 10*3/uL (ref 0.0–0.5)
Eosinophils Relative: 0 %
HCT: 45.8 % (ref 39.0–52.0)
Hemoglobin: 16 g/dL (ref 13.0–17.0)
Immature Granulocytes: 1 %
Lymphocytes Relative: 7 %
Lymphs Abs: 0.9 10*3/uL (ref 0.7–4.0)
MCH: 34.6 pg — ABNORMAL HIGH (ref 26.0–34.0)
MCHC: 34.9 g/dL (ref 30.0–36.0)
MCV: 98.9 fL (ref 80.0–100.0)
Monocytes Absolute: 0.6 10*3/uL (ref 0.1–1.0)
Monocytes Relative: 4 %
Neutro Abs: 12.3 10*3/uL — ABNORMAL HIGH (ref 1.7–7.7)
Neutrophils Relative %: 88 %
Platelets: 266 10*3/uL (ref 150–400)
RBC: 4.63 MIL/uL (ref 4.22–5.81)
RDW: 10.9 % — ABNORMAL LOW (ref 11.5–15.5)
WBC: 13.9 10*3/uL — ABNORMAL HIGH (ref 4.0–10.5)
nRBC: 0 % (ref 0.0–0.2)

## 2019-08-18 LAB — URINALYSIS, ROUTINE W REFLEX MICROSCOPIC
Bilirubin Urine: NEGATIVE
Glucose, UA: NEGATIVE mg/dL
Hgb urine dipstick: NEGATIVE
Ketones, ur: NEGATIVE mg/dL
Leukocytes,Ua: NEGATIVE
Nitrite: NEGATIVE
Protein, ur: NEGATIVE mg/dL
Specific Gravity, Urine: 1.025 (ref 1.005–1.030)
pH: 6 (ref 5.0–8.0)

## 2019-08-18 LAB — SARS CORONAVIRUS 2 BY RT PCR (HOSPITAL ORDER, PERFORMED IN ~~LOC~~ HOSPITAL LAB): SARS Coronavirus 2: NEGATIVE

## 2019-08-18 LAB — LIPASE, BLOOD: Lipase: 26 U/L (ref 11–51)

## 2019-08-18 SURGERY — APPENDECTOMY, LAPAROSCOPIC
Anesthesia: General

## 2019-08-18 MED ORDER — LACTATED RINGERS IR SOLN
Status: DC | PRN
  Administered 2019-08-18: 1000 mL

## 2019-08-18 MED ORDER — HYDROCODONE-ACETAMINOPHEN 5-325 MG PO TABS: 1.0000 | ORAL_TABLET | ORAL | Status: DC | PRN

## 2019-08-18 MED ORDER — IOHEXOL 300 MG/ML  SOLN
100.0000 mL | Freq: Once | INTRAMUSCULAR | Status: AC | PRN
  Administered 2019-08-18: 100 mL via INTRAVENOUS

## 2019-08-18 MED ORDER — MIDAZOLAM HCL 2 MG/2ML IJ SOLN
INTRAMUSCULAR | Status: AC
  Filled 2019-08-18: qty 2

## 2019-08-18 MED ORDER — ONDANSETRON 4 MG PO TBDP: 4.0000 mg | ORAL_TABLET | Freq: Four times a day (QID) | ORAL | Status: DC | PRN

## 2019-08-18 MED ORDER — ONDANSETRON HCL 4 MG/2ML IJ SOLN: 4.0000 mg | Freq: Four times a day (QID) | INTRAMUSCULAR | Status: DC | PRN

## 2019-08-18 MED ORDER — METRONIDAZOLE IN NACL 5-0.79 MG/ML-% IV SOLN
500.0000 mg | Freq: Once | INTRAVENOUS | Status: AC
  Administered 2019-08-18: 500 mg via INTRAVENOUS
  Filled 2019-08-18: qty 100

## 2019-08-18 MED ORDER — ONDANSETRON HCL 4 MG/2ML IJ SOLN
4.0000 mg | Freq: Once | INTRAMUSCULAR | Status: AC
  Administered 2019-08-18: 4 mg via INTRAVENOUS
  Filled 2019-08-18: qty 2

## 2019-08-18 MED ORDER — HYDROMORPHONE HCL 1 MG/ML IJ SOLN
1.0000 mg | Freq: Once | INTRAMUSCULAR | Status: AC
  Administered 2019-08-18: 1 mg via INTRAVENOUS
  Filled 2019-08-18: qty 1

## 2019-08-18 MED ORDER — PANTOPRAZOLE SODIUM 40 MG IV SOLR
40.0000 mg | Freq: Every day | INTRAVENOUS | Status: DC
  Administered 2019-08-18: 40 mg via INTRAVENOUS
  Filled 2019-08-18: qty 40

## 2019-08-18 MED ORDER — FAMOTIDINE IN NACL 20-0.9 MG/50ML-% IV SOLN
20.0000 mg | Freq: Once | INTRAVENOUS | Status: AC
  Administered 2019-08-18: 20 mg via INTRAVENOUS
  Filled 2019-08-18: qty 50

## 2019-08-18 MED ORDER — FENTANYL CITRATE (PF) 250 MCG/5ML IJ SOLN
INTRAMUSCULAR | Status: AC
  Filled 2019-08-18: qty 5

## 2019-08-18 MED ORDER — ACETAMINOPHEN 10 MG/ML IV SOLN: 1000.0000 mg | Freq: Once | INTRAVENOUS | Status: DC | PRN

## 2019-08-18 MED ORDER — BUPIVACAINE HCL (PF) 0.25 % IJ SOLN
INTRAMUSCULAR | Status: DC | PRN
  Administered 2019-08-18: 30 mL

## 2019-08-18 MED ORDER — LACTATED RINGERS IV SOLN
INTRAVENOUS | Status: DC | PRN
Start: 2019-08-18 — End: 2019-08-18

## 2019-08-18 MED ORDER — MIDAZOLAM HCL 5 MG/5ML IJ SOLN
INTRAMUSCULAR | Status: DC | PRN
  Administered 2019-08-18: 2 mg via INTRAVENOUS

## 2019-08-18 MED ORDER — KCL IN DEXTROSE-NACL 20-5-0.9 MEQ/L-%-% IV SOLN
INTRAVENOUS | Status: DC
  Filled 2019-08-18: qty 1000

## 2019-08-18 MED ORDER — LIDOCAINE VISCOUS HCL 2 % MT SOLN
15.0000 mL | Freq: Once | OROMUCOSAL | Status: AC
  Administered 2019-08-18: 15 mL via ORAL
  Filled 2019-08-18: qty 15

## 2019-08-18 MED ORDER — SODIUM CHLORIDE 0.9 % IV SOLN
INTRAVENOUS | Status: DC | PRN
  Administered 2019-08-18: 250 mL via INTRAVENOUS

## 2019-08-18 MED ORDER — MORPHINE SULFATE (PF) 4 MG/ML IV SOLN
4.0000 mg | Freq: Once | INTRAVENOUS | Status: AC
  Administered 2019-08-18: 4 mg via INTRAVENOUS
  Filled 2019-08-18: qty 1

## 2019-08-18 MED ORDER — FENTANYL CITRATE (PF) 100 MCG/2ML IJ SOLN: 25.0000 ug | INTRAMUSCULAR | Status: DC | PRN

## 2019-08-18 MED ORDER — ONDANSETRON HCL 4 MG/2ML IJ SOLN
INTRAMUSCULAR | Status: AC
  Filled 2019-08-18: qty 4

## 2019-08-18 MED ORDER — OXYCODONE HCL 5 MG PO TABS: 5.0000 mg | ORAL_TABLET | Freq: Once | ORAL | Status: DC | PRN

## 2019-08-18 MED ORDER — METHOCARBAMOL 500 MG PO TABS
500.0000 mg | ORAL_TABLET | Freq: Four times a day (QID) | ORAL | Status: DC | PRN
  Administered 2019-08-19: 500 mg via ORAL
  Filled 2019-08-18: qty 1

## 2019-08-18 MED ORDER — PROPOFOL 10 MG/ML IV BOLUS
INTRAVENOUS | Status: DC | PRN
  Administered 2019-08-18: 140 mg via INTRAVENOUS
  Administered 2019-08-18: 20 mg via INTRAVENOUS

## 2019-08-18 MED ORDER — PANTOPRAZOLE SODIUM 40 MG IV SOLR
40.0000 mg | Freq: Once | INTRAVENOUS | Status: AC
  Administered 2019-08-18: 40 mg via INTRAVENOUS
  Filled 2019-08-18: qty 40

## 2019-08-18 MED ORDER — PROMETHAZINE HCL 25 MG/ML IJ SOLN: 6.2500 mg | INTRAMUSCULAR | Status: DC | PRN

## 2019-08-18 MED ORDER — FENTANYL CITRATE (PF) 100 MCG/2ML IJ SOLN
INTRAMUSCULAR | Status: DC | PRN
  Administered 2019-08-18: 100 ug via INTRAVENOUS
  Administered 2019-08-18 (×3): 50 ug via INTRAVENOUS

## 2019-08-18 MED ORDER — SODIUM CHLORIDE 0.9 % IV SOLN
2.0000 g | Freq: Once | INTRAVENOUS | Status: AC
  Administered 2019-08-18: 2 g via INTRAVENOUS
  Filled 2019-08-18: qty 20

## 2019-08-18 MED ORDER — SUGAMMADEX SODIUM 200 MG/2ML IV SOLN
INTRAVENOUS | Status: DC | PRN
  Administered 2019-08-18: 200 mg via INTRAVENOUS

## 2019-08-18 MED ORDER — SODIUM CHLORIDE 0.9 % IV BOLUS
500.0000 mL | Freq: Once | INTRAVENOUS | Status: AC
  Administered 2019-08-18: 500 mL via INTRAVENOUS

## 2019-08-18 MED ORDER — ROCURONIUM BROMIDE 10 MG/ML (PF) SYRINGE
PREFILLED_SYRINGE | INTRAVENOUS | Status: DC | PRN
  Administered 2019-08-18: 20 mg via INTRAVENOUS
  Administered 2019-08-18: 40 mg via INTRAVENOUS

## 2019-08-18 MED ORDER — ONDANSETRON HCL 4 MG/2ML IJ SOLN
INTRAMUSCULAR | Status: DC | PRN
  Administered 2019-08-18: 4 mg via INTRAVENOUS

## 2019-08-18 MED ORDER — OXYCODONE HCL 5 MG/5ML PO SOLN: 5.0000 mg | Freq: Once | ORAL | Status: DC | PRN

## 2019-08-18 MED ORDER — PROPOFOL 10 MG/ML IV BOLUS
INTRAVENOUS | Status: AC
  Filled 2019-08-18: qty 20

## 2019-08-18 MED ORDER — BUPIVACAINE HCL 0.25 % IJ SOLN
INTRAMUSCULAR | Status: AC
  Filled 2019-08-18: qty 1

## 2019-08-18 MED ORDER — DEXAMETHASONE SODIUM PHOSPHATE 10 MG/ML IJ SOLN
INTRAMUSCULAR | Status: AC
  Filled 2019-08-18: qty 1

## 2019-08-18 MED ORDER — DEXAMETHASONE SODIUM PHOSPHATE 10 MG/ML IJ SOLN
INTRAMUSCULAR | Status: DC | PRN
Start: 2019-08-18 — End: 2019-08-18
  Administered 2019-08-18: 10 mg via INTRAVENOUS

## 2019-08-18 MED ORDER — HEPARIN SODIUM (PORCINE) 5000 UNIT/ML IJ SOLN
5000.0000 [IU] | Freq: Three times a day (TID) | INTRAMUSCULAR | Status: DC
  Administered 2019-08-19: 5000 [IU] via SUBCUTANEOUS
  Filled 2019-08-18: qty 1

## 2019-08-18 MED ORDER — LIDOCAINE 2% (20 MG/ML) 5 ML SYRINGE
INTRAMUSCULAR | Status: DC | PRN
  Administered 2019-08-18: 100 mg via INTRAVENOUS

## 2019-08-18 MED ORDER — ALUM & MAG HYDROXIDE-SIMETH 200-200-20 MG/5ML PO SUSP
30.0000 mL | Freq: Once | ORAL | Status: AC
  Administered 2019-08-18: 30 mL via ORAL
  Filled 2019-08-18: qty 30

## 2019-08-18 MED ORDER — SUCCINYLCHOLINE CHLORIDE 200 MG/10ML IV SOSY
PREFILLED_SYRINGE | INTRAVENOUS | Status: DC | PRN
  Administered 2019-08-18: 100 mg via INTRAVENOUS

## 2019-08-18 MED ORDER — MORPHINE SULFATE (PF) 2 MG/ML IV SOLN: 1.0000 mg | INTRAVENOUS | Status: DC | PRN

## 2019-08-18 SURGICAL SUPPLY — 30 items
APPLIER CLIP ROT 10 11.4 M/L (STAPLE)
CABLE HIGH FREQUENCY MONO STRZ (ELECTRODE) IMPLANT
CHLORAPREP W/TINT 26 (MISCELLANEOUS) ×3 IMPLANT
CLIP APPLIE ROT 10 11.4 M/L (STAPLE) IMPLANT
COVER WAND RF STERILE (DRAPES) IMPLANT
CUTTER FLEX LINEAR 45M (STAPLE) ×3 IMPLANT
DECANTER SPIKE VIAL GLASS SM (MISCELLANEOUS) ×3 IMPLANT
DERMABOND ADVANCED (GAUZE/BANDAGES/DRESSINGS) ×2
DERMABOND ADVANCED .7 DNX12 (GAUZE/BANDAGES/DRESSINGS) ×1 IMPLANT
ELECT REM PT RETURN 15FT ADLT (MISCELLANEOUS) ×3 IMPLANT
ENDOLOOP SUT PDS II  0 18 (SUTURE)
ENDOLOOP SUT PDS II 0 18 (SUTURE) IMPLANT
GLOVE BIO SURGEON STRL SZ7.5 (GLOVE) ×15 IMPLANT
GOWN STRL REUS W/TWL XL LVL3 (GOWN DISPOSABLE) ×6 IMPLANT
KIT BASIN (CUSTOM PROCEDURE TRAY) ×3 IMPLANT
KIT TURNOVER KIT A (KITS) ×3 IMPLANT
PENCIL SMOKE EVACUATOR (MISCELLANEOUS) IMPLANT
POUCH SPECIMEN RETRIEVAL 10MM (ENDOMECHANICALS) ×3 IMPLANT
RELOAD 45 VASCULAR/THIN (ENDOMECHANICALS) IMPLANT
RELOAD STAPLE TA45 3.5 REG BLU (ENDOMECHANICALS) ×3 IMPLANT
SCISSORS LAP 5X35 DISP (ENDOMECHANICALS) IMPLANT
SET IRRIG TUBING LAPAROSCOPIC (IRRIGATION / IRRIGATOR) ×3 IMPLANT
SET TUBE SMOKE EVAC HIGH FLOW (TUBING) ×3 IMPLANT
SHEARS HARMONIC ACE PLUS 36CM (ENDOMECHANICALS) ×3 IMPLANT
SUT MNCRL AB 4-0 PS2 18 (SUTURE) ×3 IMPLANT
TOWEL OR 17X26 10 PK STRL BLUE (TOWEL DISPOSABLE) ×3 IMPLANT
TRAY FOLEY MTR SLVR 16FR STAT (SET/KITS/TRAYS/PACK) ×3 IMPLANT
TRAY LAPAROSCOPIC (CUSTOM PROCEDURE TRAY) ×3 IMPLANT
TROCAR BLADELESS OPT 5 100 (ENDOMECHANICALS) ×3 IMPLANT
TROCAR XCEL BLUNT TIP 100MML (ENDOMECHANICALS) ×3 IMPLANT

## 2019-08-18 NOTE — ED Provider Notes (Signed)
[facility] EMERGENCY DEPARTMENT Provider Note   CSN: [id number] Arrival date & time: 08/18/19  1258     History Chief Complaint  Patient presents with  . Abdominal Pain    Austin Burke is a 54 y.o. male who reports himself as otherwise healthy and no daily medication use presents today for abdominal pain onset this morning shortly after waking up gradually worsening upper abdominal pain described as a severe constant pressure pain worsened with palpation no alleviating factors attempted ibuprofen prior to arrival without relief, pain occasionally radiates to his lower abdomen bilaterally.  He reports he was at an urgent care earlier today but was sent to the ER for evaluation of possible gallbladder infection.  Reports nausea without vomiting.  Denies fever/chills, sore throat, chest pain/shortness of breath, cough/mopped assist, hematemesis, diarrhea, dysuria/hematuria, testicular pain/swelling or any additional concerns.  HPI     History reviewed. No pertinent past medical history.  There are no problems to display for this patient.        No family history on file.  Social History   Tobacco Use  . Smoking status: Not on file  Substance Use Topics  . Alcohol use: Not on file  . Drug use: Not on file    Home Medications Prior to Admission medications   Not on File    Allergies    Patient has no known allergies.  Review of Systems   Review of Systems Ten systems are reviewed and are negative for acute change except as noted in the HPI  Physical Exam Updated Vital Signs BP (!) 147/85 (BP Location: Right Arm)   Pulse (!) 54   Temp 97.9 F (36.6 C) (Oral)   Resp 16   Ht 5\' 10"  (1.778 m)   Wt 72.6 kg   SpO2 96%   BMI 22.96 kg/m   Physical Exam Constitutional:      General: He is not in acute distress.    Appearance: Normal appearance. He is well-developed. He is not ill-appearing or diaphoretic.  HENT:     Head: Normocephalic and  atraumatic.     Right Ear: External ear normal.     Left Ear: External ear normal.     Nose: Nose normal.  Eyes:     General: Vision grossly intact. Gaze aligned appropriately.     Pupils: Pupils are equal, round, and reactive to light.  Neck:     Trachea: Trachea and phonation normal. No tracheal deviation.  Pulmonary:     Effort: Pulmonary effort is normal. No respiratory distress.  Abdominal:     General: There is no distension.     Palpations: Abdomen is soft.     Tenderness: There is abdominal tenderness in the right upper quadrant, epigastric area and left upper quadrant. There is no guarding or rebound.  Musculoskeletal:        General: Normal range of motion.     Cervical back: Normal range of motion.  Skin:    General: Skin is warm and dry.  Neurological:     Mental Status: He is alert.     GCS: GCS eye subscore is 4. GCS verbal subscore is 5. GCS motor subscore is 6.     Comments: Speech is clear and goal oriented, follows commands Major Cranial nerves without deficit, no facial droop Moves extremities without ataxia, coordination intact  Psychiatric:        Behavior: Behavior normal.    ED Results / Procedures / Treatments   Labs (  all labs ordered are listed, but only abnormal results are displayed) Labs Reviewed  CBC WITH DIFFERENTIAL/PLATELET - Abnormal; Notable for the following components:      Result Value   WBC 13.9 (*)    MCH 34.6 (*)    RDW 10.9 (*)    Neutro Abs 12.3 (*)    All other components within normal limits  COMPREHENSIVE METABOLIC PANEL - Abnormal; Notable for the following components:   Glucose, Bld 114 (*)    All other components within normal limits  SARS CORONAVIRUS 2 BY RT PCR (HOSPITAL ORDER, PERFORMED IN [facility] LAB)  LIPASE, BLOOD  URINALYSIS, ROUTINE W REFLEX MICROSCOPIC    EKG EKG Interpretation  Date/Time:  Thursday Aug 18 2019 17:02:28 EDT Ventricular Rate:  54 PR Interval:    QRS Duration: 105 QT  Interval:  439 QTC Calculation: 416 R Axis:   69 Text Interpretation: Sinus rhythm RSR' in V1 or V2, right VCD or RVH No old tracing to compare Confirmed by Jacalyn Lefevre [phone]) on 08/18/2019 5:09:26 PM   Radiology CT ABDOMEN PELVIS W CONTRAST  Result Date: 08/18/2019 CLINICAL DATA:  Abdominal abscess or infection suspected. Right-sided abdominal pain. EXAM: CT ABDOMEN AND PELVIS WITH CONTRAST TECHNIQUE: Multidetector CT imaging of the abdomen and pelvis was performed using the standard protocol following bolus administration of intravenous contrast. CONTRAST:  OMNIPAQUE IOHEXOL 300 MG/ML  SOLN COMPARISON:  None. FINDINGS: Lower chest: The lung bases are clear. The heart size is normal. Hepatobiliary: The liver is normal. Normal gallbladder.There is no biliary ductal dilation. Pancreas: Normal contours without ductal dilatation. No peripancreatic fluid collection. Spleen: Unremarkable. Adrenals/Urinary Tract: --Adrenal glands: Unremarkable. --Right kidney/ureter: There is a nonobstructing 2-3 mm stone in the interpolar region of the right kidney. --Left kidney/ureter: No hydronephrosis or radiopaque kidney stones. --Urinary bladder: Unremarkable. Stomach/Bowel: --Stomach/Duodenum: No hiatal hernia or other gastric abnormality. Normal duodenal course and caliber. --Small bowel: Unremarkable. --Colon: Unremarkable. --Appendix: The appendix is dilated measuring up to approximately 1.3 cm (axial series 2, image 53). There are adjacent periappendiceal inflammatory changes. There is no fluid collection or extraluminal air Vascular/Lymphatic: Normal course and caliber of the major abdominal vessels. --No retroperitoneal lymphadenopathy. --No mesenteric lymphadenopathy. --No pelvic or inguinal lymphadenopathy. Reproductive: Incidentally noted are bilateral hydroceles, left greater than right. Other: No ascites or free air. The abdominal wall is normal. Musculoskeletal. No acute displaced fractures.  IMPRESSION: 1. Acute uncomplicated appendicitis. 2. Incidentally noted bilateral hydroceles, left greater than right. 3. Nonobstructive right nephrolithiasis. Electronically Signed   By: Katherine Mantle M.D.   On: 08/18/2019 17:48   US Abdomen Limited RUQ  Result Date: 08/18/2019 CLINICAL DATA:  Pain EXAM: ULTRASOUND ABDOMEN LIMITED RIGHT UPPER QUADRANT COMPARISON:  None. 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 There are no gallstones. The sonographic Eulah Pont sign is negative. There is no gallbladder wall thickening. Common bile duct: Diameter: 4 mm Liver: No focal lesion identified. Within normal limits in parenchymal echogenicity. Portal vein is patent on color Doppler imaging with normal direction of blood flow towards the liver. Other: None. IMPRESSION: No acute abnormality. No evidence for cholelithiasis or acute cholecystitis. Electronically Signed   By: Katherine Mantle M.D.   On: 08/18/2019 16:48    Procedures Procedures (including critical care time)  Medications Ordered in ED Medications  0.9 %  sodium chloride infusion (250 mLs Intravenous New Bag/Given 08/18/19 1710)  cefTRIAXone (ROCEPHIN) 2 g in sodium chloride 0.9 % 100 mL IVPB (has no administration in time range)  And  metroNIDAZOLE (FLAGYL) IVPB 500 mg (has no administration in time range)  ondansetron (ZOFRAN) injection 4 mg (4 mg Intravenous Given 08/18/19 1613)  morphine 4 MG/ML injection 4 mg (4 mg Intravenous Given 08/18/19 1613)  famotidine (PEPCID) IVPB 20 mg premix (20 mg Intravenous New Bag/Given 08/18/19 1711)  pantoprazole (PROTONIX) injection 40 mg (40 mg Intravenous Given 08/18/19 1706)  alum & mag hydroxide-simeth (MAALOX/MYLANTA) 200-200-20 MG/5ML suspension 30 mL (30 mLs Oral Given 08/18/19 1701)    And  lidocaine (XYLOCAINE) 2 % viscous mouth solution 15 mL (15 mLs Oral Given 08/18/19 1701)  sodium chloride 0.9 % bolus 500 mL (500 mLs Intravenous New Bag/Given 08/18/19 1705)  iohexol  (OMNIPAQUE) 300 MG/ML solution 100 mL (100 mLs Intravenous Contrast Given 08/18/19 1715)    ED Course  I have reviewed the triage vital signs and the nursing notes.  Pertinent labs & imaging results that were available during my care of the patient were reviewed by me and considered in my medical decision making (see chart for details).  Clinical Course as of Aug 18 1810  Thu Aug 18, 2019  1808 Dr. Marlou Starks; Transfer to [facility] ER.   [BM]  1810 Dr. Rogene Houston   [BM]    Clinical Course User Index [BM] Gari Crown   MDM Rules/Calculators/A&P                     I ordered, reviewed and interpreted labs which include: Urinalysis without evidence of infection no hemoglobin to suggest kidney stone. CBC shows leukocytosis of 13.9 with left shift, no evidence of anemia. Lipase normal limits no evidence of pancreatitis. CMP shows no emergent electrolyte derangement, evidence of acute kidney injury or acute elevation of LFTs.  RUQ [place]:    IMPRESSION:  No acute abnormality. No evidence for cholelithiasis or acute  cholecystitis.  ----------------- Patient reevaluated some continued pain, given upper abdominal pain radiating to the lower abdomen and leukocytosis will obtain CT abdomen pelvis for further evaluation.  Additionally will give Pepcid and Protonix for possible reflux etiology. --------------- CT AP:  IMPRESSION:  1. Acute uncomplicated appendicitis.  2. Incidentally noted bilateral hydroceles, left greater than right.  3. Nonobstructive right nephrolithiasis.  IV antibiotics Rocephin and Flagyl ordered.  Consult placed to general surgery.  Covid test ordered.  Patient updated on findings and states understanding.  He is agreeable for transfer, general surgery evaluation and admission.  Additional pain medication ordered.  He has no further questions. - 6:08 PM: Discussed case with general surgeon Dr. Marlou Starks who recommended ED to ED transfer to [facility] so general surgery  can evaluate the patient. - 6:10 PM: Discussed case with Dr. Rogene Houston at [facility] [place], accepts patient in transfer. - Nursing secretary is contacting [facility] for transfer. - 6:14 PM: Patient updated on plan of care is agreeable to transfer to [facility].  No further questions or concerns.  Note: Portions of this report may have been transcribed using voice recognition software. Every effort was made to ensure accuracy; however, inadvertent computerized transcription errors may still be present. Final Clinical Impression(s) / ED Diagnoses Final diagnoses:  RUQ abdominal pain  Acute appendicitis with localized peritonitis, without perforation, abscess, or gangrene    Rx / DC Orders ED Discharge Orders    None       Gari Crown 08/18/19 1816    Isla Pence, MD 08/18/19 2055

## 2019-08-18 NOTE — ED Notes (Signed)
Urine and labs shown to EDP and in paper chart r/t downtime.

## 2019-08-18 NOTE — Plan of Care (Signed)
  Problem: Education: Goal: Knowledge of General Education information will improve Description: Including pain rating scale, medication(s)/side effects and non-pharmacologic comfort measures Outcome: Progressing   Problem: Pain Managment: Goal: General experience of comfort will improve Outcome: Progressing   

## 2019-08-18 NOTE — Transfer of Care (Signed)
Immediate Anesthesia Transfer of Care Note  Patient: Keyshawn Hellwig  Procedure(s) Performed: APPENDECTOMY LAPAROSCOPIC (N/A )  Patient Location: PACU  Anesthesia Type:General  Level of Consciousness: awake, alert , oriented and patient cooperative  Airway & Oxygen Therapy: Patient Spontanous Breathing and Patient connected to face mask oxygen  Post-op Assessment: Report given to RN, Post -op Vital signs reviewed and stable and Patient moving all extremities X 4  Post vital signs: stable  Last Vitals:  Vitals Value Taken Time  BP    Temp    Pulse 109 08/18/19 2148  Resp 12 08/18/19 2148  SpO2 97 % 08/18/19 2148  Vitals shown include unvalidated device data.  Last Pain:  Vitals:   08/18/19 1918  TempSrc:   PainSc: 4          Complications: No apparent anesthesia complications

## 2019-08-18 NOTE — ED Notes (Signed)
Assumed care at this time

## 2019-08-18 NOTE — Anesthesia Postprocedure Evaluation (Signed)
Anesthesia Post Note  Patient: Austin Burke  Procedure(s) Performed: APPENDECTOMY LAPAROSCOPIC (N/A )     Patient location during evaluation: PACU Anesthesia Type: General Level of consciousness: awake and alert and oriented Pain management: pain level controlled Vital Signs Assessment: post-procedure vital signs reviewed and stable Respiratory status: spontaneous breathing, nonlabored ventilation and respiratory function stable Cardiovascular status: blood pressure returned to baseline Postop Assessment: no apparent nausea or vomiting Anesthetic complications: no    Last Vitals:  Vitals:   08/18/19 1842 08/18/19 2148  BP: 138/77   Pulse: 62   Resp: 16   Temp: 36.7 C 37.3 C  SpO2: 99%     Last Pain:  Vitals:   08/18/19 2148  TempSrc:   PainSc: 0-No pain                 Kaylyn Layer

## 2019-08-18 NOTE — Anesthesia Preprocedure Evaluation (Signed)
Anesthesia Evaluation  Patient identified by MRN, date of birth, ID band Patient awake    Reviewed: Allergy & Precautions, NPO status , Patient's Chart, lab work & pertinent test results  History of Anesthesia Complications Negative for: history of anesthetic complications  Airway Mallampati: I  TM Distance: >3 FB Neck ROM: Full    Dental  (+) Chipped,    Pulmonary former smoker,    Pulmonary exam normal        Cardiovascular negative cardio ROS Normal cardiovascular exam     Neuro/Psych negative neurological ROS  negative psych ROS   GI/Hepatic Neg liver ROS, Acute appendicitis   Endo/Other  negative endocrine ROS  Renal/GU negative Renal ROS  negative genitourinary   Musculoskeletal negative musculoskeletal ROS (+)   Abdominal   Peds  Hematology negative hematology ROS (+)   Anesthesia Other Findings Day of surgery medications reviewed with patient.  Reproductive/Obstetrics negative OB ROS                             Anesthesia Physical Anesthesia Plan  ASA: II and emergent  Anesthesia Plan: General   Post-op Pain Management:    Induction: Intravenous  PONV Risk Score and Plan: 4 or greater and Treatment may vary due to age or medical condition, Ondansetron, Dexamethasone and Midazolam  Airway Management Planned: Oral ETT  Additional Equipment: None  Intra-op Plan:   Post-operative Plan: Extubation in OR  Informed Consent: I have reviewed the patients History and Physical, chart, labs and discussed the procedure including the risks, benefits and alternatives for the proposed anesthesia with the patient or authorized representative who has indicated his/her understanding and acceptance.     Dental advisory given  Plan Discussed with: CRNA  Anesthesia Plan Comments:         Anesthesia Quick Evaluation

## 2019-08-18 NOTE — ED Provider Notes (Signed)
[facility]    CSN: [id number] Arrival date & time: 08/18/19  1147      History   Chief Complaint Chief Complaint  Patient presents with  . Abdominal Pain    HPI Austin Burke is a 54 y.o. male.  With history of umbilical hernia, internal hemorrhoids presenting for epigastric pain.  States this began suddenly around 3 AM: Woke him from sleep.  Patient took ibuprofen with temporary relief.  States when he woke up around 10 pain was worse.  Denies nausea, vomiting, heartburn, reflux.  Denies alcohol use, tobacco use, history of pancreatitis or hepatitis.  No known history of cholelithiasis.  Denies fever, chest pain, difficulty breathing.  Last BM last night: Normal for him.  No urinary symptoms, penile or testicular pain, discharge.   History reviewed. No pertinent past medical history.  Patient Active Problem List   Diagnosis Date Noted  . Internal hemorrhoids 09/19/2012  . Umbilical hernia 11/01/2011    Past Surgical History:  Procedure Laterality Date  . KNEE SURGERY     left in 1987       Home Medications    Prior to Admission medications   Medication Sig Start Date End Date Taking? Authorizing Provider  amoxicillin (AMOXIL) 500 MG capsule Take 1 capsule (500 mg total) by mouth 3 (three) times daily. 05/13/18   Margaree Mackintosh, MD  Multiple Vitamin (MULTIVITAMIN) tablet Take 1 tablet by mouth daily.    [provider]    Family History Family History  Problem Relation Age of Onset  . COPD Father     Social History Social History   Tobacco Use  . Smoking status: Former Smoker    Quit date: 05/02/2011    Years since quitting: 8.3  . Smokeless tobacco: Never Used  Substance Use Topics  . Alcohol use: No    Comment: rarely  . Drug use: No    Comment: steroids     Allergies   Patient has no known allergies.   Review of Systems As per HPI   Physical Exam Triage Vital Signs ED Triage Vitals  Enc Vitals Group     BP       Pulse      Resp      Temp      Temp src      SpO2      Weight      Height      Head Circumference      Peak Flow      Pain Score      Pain Loc      Pain Edu?      Excl. in GC?    No data found.  Updated Vital Signs BP 123/78   Pulse (!) 102   Temp 98.8 F (37.1 C)   Resp 18   SpO2 94%   Visual Acuity Right Eye Distance:   Left Eye Distance:   Bilateral Distance:    Right Eye Near:   Left Eye Near:    Bilateral Near:     Physical Exam Constitutional:      General: He is not in acute distress. HENT:     Head: Normocephalic and atraumatic.  Eyes:     General: No scleral icterus.    Pupils: Pupils are equal, round, and reactive to light.  Cardiovascular:     Rate and Rhythm: Normal rate.  Pulmonary:     Effort: Pulmonary effort is normal. No respiratory distress.  Breath sounds: No wheezing.  Abdominal:     General: Abdomen is flat. There is no distension or abdominal bruit.     Palpations: There is no splenomegaly.     Tenderness: There is abdominal tenderness in the right upper quadrant. There is guarding. There is no right CVA tenderness, left CVA tenderness or rebound. Positive signs include Murphy's sign. Negative signs include Rovsing's sign and McBurney's sign.     Comments: Exam somewhat limited due to patient's voluntary guarding.  ?Hepatomegaly, negative splenomegaly.  Skin:    Coloration: Skin is not jaundiced or pale.  Neurological:     Mental Status: He is alert and oriented to person, place, and time.      UC Treatments / Results  Labs (all labs ordered are listed, but only abnormal results are displayed) Labs Reviewed - No data to display  EKG   Radiology No results found.  Procedures Procedures (including critical care time)  Medications Ordered in UC Medications - No data to display  Initial Impression / Assessment and Plan / UC Course  I have reviewed the triage vital signs and the nursing notes.  Pertinent labs & imaging  results that were available during my care of the patient were reviewed by me and considered in my medical decision making (see chart for details).     Patient afebrile, nontoxic in office today.  Patient endorsing significant RUQ pain with positive Murphy sign.  States pain is "intolerable".  Discussed further evaluation in ER: Patient electing to do this.  Return precautions discussed, patient verbalized understanding and is agreeable to plan. Final Clinical Impressions(s) / UC Diagnoses   Final diagnoses:  RUQ pain   Discharge Instructions   None    ED Prescriptions    None     PDMP not reviewed this encounter.   [place], [place], [place] 08/18/19 1501

## 2019-08-18 NOTE — ED Triage Notes (Signed)
Pt arrives during downtime, see downtime forms.  Per triage RN during downtime, pt c/o side and abdominal pain. Seen at [facility] for same.

## 2019-08-18 NOTE — H&P (Signed)
Austin Burke is an 54 y.o. male.   Chief Complaint: Abdominal pain HPI: The patient is a 54 year old white male who presents with abdominal pain that started this morning.  The pain is mostly in his right lower quadrant.  He describes the pain as a uncomfortable gassy feeling.  He has had some nausea but no vomiting.  He denies any fevers or chills.  He is otherwise in good health.  He went to the emergency department where a CT scan showed what appears to be appendicitis with no evidence of perforation.  History reviewed. No pertinent past medical history.    No family history on file. Social History:  has no history on file for tobacco, alcohol, and drug.  Allergies: No Known Allergies  (Not in a hospital admission)   Results for orders placed or performed during the hospital encounter of 08/18/19 (from the past 48 hour(s))  Urinalysis, Routine w reflex microscopic     Status: None   Collection Time: 08/18/19  1:20 PM  Result Value Ref Range   Color, Urine YELLOW YELLOW   APPearance CLEAR CLEAR   Specific Gravity, Urine 1.025 1.005 - 1.030   pH 6.0 5.0 - 8.0   Glucose, UA NEGATIVE NEGATIVE mg/dL   Hgb urine dipstick NEGATIVE NEGATIVE   Bilirubin Urine NEGATIVE NEGATIVE   Ketones, ur NEGATIVE NEGATIVE mg/dL   Protein, ur NEGATIVE NEGATIVE mg/dL   Nitrite NEGATIVE NEGATIVE   Leukocytes,Ua NEGATIVE NEGATIVE    Comment: Microscopic not done on urines with negative protein, blood, leukocytes, nitrite, or glucose < 500 mg/dL. Performed at [facility], [address] [place], [place] [address]   CBC with Differential     Status: Abnormal   Collection Time: 08/18/19  2:00 PM  Result Value Ref Range   WBC 13.9 (H) 4.0 - 10.5 K/uL   RBC 4.63 4.22 - 5.81 MIL/uL   Hemoglobin 16.0 13.0 - 17.0 g/dL   HCT 11.9 41.7 - 40.8 %   MCV 98.9 80.0 - 100.0 fL   MCH 34.6 (H) 26.0 - 34.0 pg   MCHC 34.9 30.0 - 36.0 g/dL   RDW 14.4 (L) 81.8 - 56.3 %   Platelets 266 150 - 400 K/uL    nRBC 0.0 0.0 - 0.2 %   Neutrophils Relative % 88 %   Neutro Abs 12.3 (H) 1.7 - 7.7 K/uL   Lymphocytes Relative 7 %   Lymphs Abs 0.9 0.7 - 4.0 K/uL   Monocytes Relative 4 %   Monocytes Absolute 0.6 0.1 - 1.0 K/uL   Eosinophils Relative 0 %   Eosinophils Absolute 0.0 0.0 - 0.5 K/uL   Basophils Relative 0 %   Basophils Absolute 0.1 0.0 - 0.1 K/uL   Immature Granulocytes 1 %   Abs Immature Granulocytes 0.07 0.00 - 0.07 K/uL    Comment: Performed at [facility], [address] [place], [place] [address]  Comprehensive metabolic panel     Status: Abnormal   Collection Time: 08/18/19  2:00 PM  Result Value Ref Range   Sodium 136 135 - 145 mmol/L   Potassium 3.9 3.5 - 5.1 mmol/L   Chloride 100 98 - 111 mmol/L   CO2 25 22 - 32 mmol/L   Glucose, Bld 114 (H) 70 - 99 mg/dL    Comment: Glucose reference range applies only to samples taken after fasting for at least 8 hours.   BUN 9 6 - 20 mg/dL   Creatinine, Ser 2.63  0.61 - 1.24 mg/dL   Calcium 9.2 8.9 - 24.2 mg/dL   Total Protein 7.9 6.5 - 8.1 g/dL   Albumin 4.8 3.5 - 5.0 g/dL   AST 15 15 - 41 U/L   ALT 15 0 - 44 U/L   Alkaline Phosphatase 95 38 - 126 U/L   Total Bilirubin 0.9 0.3 - 1.2 mg/dL   GFR calc non Af Amer >60 >60 mL/min   GFR calc Af Amer >60 >60 mL/min   Anion gap 11 5 - 15    Comment: Performed at [facility], [address] [place], [place] [address]  Lipase, blood     Status: None   Collection Time: 08/18/19  2:00 PM  Result Value Ref Range   Lipase 26 11 - 51 U/L    Comment: Performed at [facility], [address] [place], [place] [address]  SARS Coronavirus 2 by RT PCR (hospital order, performed in [facility] hospital lab) Nasopharyngeal Nasopharyngeal Swab     Status: None   Collection Time: 08/18/19  6:11 PM   Specimen: Nasopharyngeal Swab  Result Value Ref Range   SARS Coronavirus 2 NEGATIVE NEGATIVE    Comment: (NOTE) SARS-CoV-2 target nucleic acids are NOT  DETECTED. The SARS-CoV-2 RNA is generally detectable in upper and lower respiratory specimens during the acute phase of infection. The lowest concentration of SARS-CoV-2 viral copies this assay can detect is 250 copies / mL. A negative result does not preclude SARS-CoV-2 infection and should not be used as the sole basis for treatment or other patient management decisions.  A negative result may occur with improper specimen collection / handling, submission of specimen other than nasopharyngeal swab, presence of viral mutation(s) within the areas targeted by this assay, and inadequate number of viral copies (<250 copies / mL). A negative result must be combined with clinical observations, patient history, and epidemiological information. Fact Sheet for Patients:   BoilerBrush.com.cy Fact Sheet for Healthcare Providers: https://pope.com/ This test is not yet approved or cleared  by the [place] FDA and has been authorized for detection and/or diagnosis of SARS-CoV-2 by FDA under an Emergency Use Authorization (EUA).  This EUA will remain in effect (meaning this test can be used) for the duration of the COVID-19 declaration under Section 564(b)(1) of the Act, 21 U.S.C. section 360bbb-3(b)(1), unless the authorization is terminated or revoked sooner. Performed at [facility], [address] [place], [place] [address]    CT ABDOMEN PELVIS W CONTRAST  Result Date: 08/18/2019 CLINICAL DATA:  Abdominal abscess or infection suspected. Right-sided abdominal pain. EXAM: CT ABDOMEN AND PELVIS WITH CONTRAST TECHNIQUE: Multidetector CT imaging of the abdomen and pelvis was performed using the standard protocol following bolus administration of intravenous contrast. CONTRAST:  OMNIPAQUE IOHEXOL 300 MG/ML  SOLN COMPARISON:  None. FINDINGS: Lower chest: The lung bases are clear. The heart size is normal. Hepatobiliary: The liver is  normal. Normal gallbladder.There is no biliary ductal dilation. Pancreas: Normal contours without ductal dilatation. No peripancreatic fluid collection. Spleen: Unremarkable. Adrenals/Urinary Tract: --Adrenal glands: Unremarkable. --Right kidney/ureter: There is a nonobstructing 2-3 mm stone in the interpolar region of the right kidney. --Left kidney/ureter: No hydronephrosis or radiopaque kidney stones. --Urinary bladder: Unremarkable. Stomach/Bowel: --Stomach/Duodenum: No hiatal hernia or other gastric abnormality. Normal duodenal course and caliber. --Small bowel: Unremarkable. --Colon: Unremarkable. --Appendix: The appendix is dilated measuring up to approximately 1.3 cm (axial series 2, image 53). There are adjacent periappendiceal  inflammatory changes. There is no fluid collection or extraluminal air Vascular/Lymphatic: Normal course and caliber of the major abdominal vessels. --No retroperitoneal lymphadenopathy. --No mesenteric lymphadenopathy. --No pelvic or inguinal lymphadenopathy. Reproductive: Incidentally noted are bilateral hydroceles, left greater than right. Other: No ascites or free air. The abdominal wall is normal. Musculoskeletal. No acute displaced fractures. IMPRESSION: 1. Acute uncomplicated appendicitis. 2. Incidentally noted bilateral hydroceles, left greater than right. 3. Nonobstructive right nephrolithiasis. Electronically Signed   By: Constance Holster M.D.   On: 08/18/2019 17:48   US Abdomen Limited RUQ  Result Date: 08/18/2019 CLINICAL DATA:  Pain EXAM: ULTRASOUND ABDOMEN LIMITED RIGHT UPPER QUADRANT COMPARISON:  None. 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 There are no gallstones. The sonographic Percell Miller sign is negative. There is no gallbladder wall thickening. Common bile duct: Diameter: 4 mm Liver: No focal lesion identified. Within normal limits in parenchymal echogenicity. Portal vein is patent on color Doppler imaging with normal direction of blood  flow towards the liver. Other: None. IMPRESSION: No acute abnormality. No evidence for cholelithiasis or acute cholecystitis. Electronically Signed   By: Constance Holster M.D.   On: 08/18/2019 16:48    Review of Systems  Constitutional: Negative.   HENT: Negative.   Eyes: Negative.   Respiratory: Negative.   Cardiovascular: Negative.   Gastrointestinal: Positive for abdominal pain and nausea. Negative for vomiting.  Endocrine: Negative.   Genitourinary: Negative.   Musculoskeletal: Negative.   Skin: Negative.   Allergic/Immunologic: Negative.   Neurological: Negative.   Hematological: Negative.   Psychiatric/Behavioral: Negative.     Blood pressure 138/77, pulse 62, temperature 98 F (36.7 C), temperature source Oral, resp. rate 16, height 5\' 10"  (1.778 m), weight 72.6 kg, SpO2 99 %. Physical Exam  Constitutional: He is oriented to person, place, and time. He appears well-developed and well-nourished. No distress.  HENT:  Head: Normocephalic and atraumatic.  Right Ear: External ear normal.  Left Ear: External ear normal.  Mouth/Throat: Oropharynx is clear and moist.  Eyes: Pupils are equal, round, and reactive to light. Conjunctivae and EOM are normal. No scleral icterus.  Neck: No tracheal deviation present.  Cardiovascular: Normal rate, regular rhythm and normal heart sounds.  No pitting edema lower extr  Respiratory: Effort normal and breath sounds normal. No respiratory distress.  No use of accessory respiratory muscles  GI: Soft.  There is moderate tenderness in the RLQ focally. No peritonitis. No palpable mass. No obvious hernia  Musculoskeletal:        General: No deformity. Normal range of motion.     Cervical back: Normal range of motion and neck supple.     Comments: Gait normal  Lymphadenopathy:    He has no cervical adenopathy.  No palpable groin or cervical lymphadenopathy  Neurological: He is alert and oriented to person, place, and time.  Skin: Skin is  warm and dry. No rash noted. No erythema.  Psychiatric: He has a normal mood and affect. His behavior is normal. Thought content normal.     Assessment/Plan The patient appears to have acute appendicitis.  Because of the risk of perforation and sepsis I think he would benefit from having his appendix removed tonight.  I have discussed with him in detail the risks and benefits of the operation as well as some of the technical aspects and he understands and wishes to proceed.  He has been started on broad-spectrum antibiotic therapy.  Autumn Messing III, MD 08/18/2019, 8:02 PM

## 2019-08-18 NOTE — ED Provider Notes (Signed)
Patient sent to [facility] for evaluation by general surgery for acute appendicitis found on CT scan.  Patient is evaluated by Dr. Carolynne Edouard 7:55 PM  Plan is to go to surgery tonight.  Patient is Covid negative. Dr. Carolynne Edouard evaluated patient at bedside. Patient is understanding of plan. Pain is tolerable at this time.   Solon Augusta [place], [place] 08/18/19 1956    Alvira Monday, MD 08/19/19 [phone]

## 2019-08-18 NOTE — Anesthesia Procedure Notes (Addendum)
Procedure Name: Intubation Date/Time: 08/18/2019 8:48 PM Performed by: Lissa Morales, CRNA Pre-anesthesia Checklist: Patient identified, Emergency Drugs available, Suction available and Patient being monitored Patient Re-evaluated:Patient Re-evaluated prior to induction Oxygen Delivery Method: Circle system utilized Preoxygenation: Pre-oxygenation with 100% oxygen Induction Type: IV induction, Cricoid Pressure applied and Rapid sequence Ventilation: Mask ventilation without difficulty Laryngoscope Size: Mac and 4 Grade View: Grade II Tube type: Oral Tube size: 7.5 mm Number of attempts: 1 Airway Equipment and Method: Stylet and Oral airway Placement Confirmation: ETT inserted through vocal cords under direct vision,  positive ETCO2 and breath sounds checked- equal and bilateral Secured at: 23 cm Tube secured with: Tape Dental Injury: Teeth and Oropharynx as per pre-operative assessment

## 2019-08-18 NOTE — Op Note (Signed)
08/18/2019  9:32 PM  PATIENT:  Austin Burke  54 y.o. male  PRE-OPERATIVE DIAGNOSIS:  acute appendicitis  POST-OPERATIVE DIAGNOSIS:  acute appendicitis  PROCEDURE:  Procedure(s): APPENDECTOMY LAPAROSCOPIC (N/A)  SURGEON:  Surgeon(s) and Role:    * Griselda Miner, MD - Primary  PHYSICIAN ASSISTANT:   ASSISTANTS: none   ANESTHESIA:   local and general  EBL:  minimal   BLOOD ADMINISTERED:none  DRAINS: none   LOCAL MEDICATIONS USED:  MARCAINE     SPECIMEN:  Source of Specimen:  appendix  DISPOSITION OF SPECIMEN:  PATHOLOGY  COUNTS:  YES  TOURNIQUET:  * No tourniquets in log *  DICTATION: .Dragon Dictation   After informed consent was obtained patient was brought to the operating room placed in the supine position on the operating room table. After adequate induction of general anesthesia the patient's abdomen was prepped with ChloraPrep, allowed to dry, and draped in usual sterile manner. The area below the umbilicus was infiltrated with quarter percent Marcaine. A small incision was made with a 15 blade knife. This incision was carried down through the subcutaneous tissue bluntly with a hemostat and [employer] retractors until the linea alba was identified. The linea alba was incised with a 15 blade knife. Each side was grasped Coker clamps and elevated anteriorly. The preperitoneal space was probed bluntly with a hemostat until the peritoneum was opened and access was gained to the abdominal cavity. A 0 Vicryl purse string stitch was placed in the fascia surrounding the opening. A Hassan cannula was placed through the opening and anchored in place with the previously placed Vicryl purse string stitch. The laparoscope was placed through the [facility] cannula. The abdomen was insufflated with carbon dioxide without difficulty. Next the suprapubic area was infiltrated with quarter percent Marcaine. A small incision was made with a 15 blade knife. A 5 mm port was placed bluntly through  this incision into the abdominal cavity. A site was then chosen between the 2 port for placement of a 5 mm port. The area was infiltrated with quarter percent Marcaine. A small stab incision was made with a 15 blade knife. A 5 mm port was placed bluntly through this incision and the abdominal cavity under direct vision. The laparoscope was then moved to the suprapubic port. Using a Glassman grasper and harmonic scalpel the right lower quadrant was inspected. The appendix was readily identified. The appendix was elevated anteriorly and the mesoappendix was taken down sharply with the harmonic scalpel. Once the base of the appendix where it joined the cecum was identified and cleared of any tissue then a laparoscopic GIA blue load 6 row stapler was placed through the [facility] cannula. The stapler was placed across the base of the appendix clamped and fired thereby dividing the base of the appendix between staple lines. A laparoscopic bag was then inserted through the [facility] cannula. The appendix was placed within the bag and the bag was sealed. The abdomen was then irrigated with copious amounts of saline until the effluent was clear. No other abnormalities were noted. The appendix and bag were removed with the [facility] cannula through the infraumbilical port without difficulty. The fascial defect was closed with the previously placed Vicryl pursestring stitch as well as with another interrupted 0 Vicryl figure-of-eight stitch. The rest of the ports were removed under direct vision and were found to be hemostatic. The gas was allowed to escape. The skin incisions were closed with interrupted 4-0 Monocryl subcuticular stitches. Dermabond dressings were applied. The  patient tolerated the procedure well. At the end of the case all needle sponge and instrument counts were correct. The patient was then awakened and taken to recovery in stable condition.  PLAN OF CARE: Admit for overnight observation  PATIENT DISPOSITION:   PACU - hemodynamically stable.   Delay start of Pharmacological VTE agent (>24hrs) due to surgical blood loss or risk of bleeding: no

## 2019-08-18 NOTE — ED Notes (Signed)
Patient is being discharged from the Urgent Care Center and sent to the Emergency Department via POV. Per [place], [place], patient is stable but in need of higher level of care due to epigastric abdominal pain. Patient is aware and verbalizes understanding of plan of care. There were no vitals filed for this visit.

## 2019-08-19 MED ORDER — HYDROCODONE-ACETAMINOPHEN 5-325 MG PO TABS: 1.0000 | ORAL_TABLET | Freq: Four times a day (QID) | ORAL | 0 refills | Status: DC | PRN

## 2019-08-19 NOTE — Discharge Instructions (Signed)
[facility] LAPAROSCOPIC SURGERY: POST OP INSTRUCTIONS Always review your discharge instruction sheet given to you by the facility where your surgery was performed. IF YOU HAVE DISABILITY OR FAMILY LEAVE FORMS, YOU MUST BRING THEM TO THE OFFICE FOR PROCESSING.   DO NOT GIVE THEM TO YOUR DOCTOR.  PAIN CONTROL  1. First take acetaminophen (Tylenol) AND/or ibuprofen (Advil) to control your pain after surgery.  Follow directions on package.  Taking acetaminophen (Tylenol) and/or ibuprofen (Advil) regularly after surgery will help to control your pain and lower the amount of prescription pain medication you may need.  You should not take more than 3,000 mg (3 grams) of acetaminophen (Tylenol) in 24 hours.  You should not take ibuprofen (Advil), aleve, motrin, naprosyn or other NSAIDS if you have a history of stomach ulcers or chronic kidney disease.  2. A prescription for pain medication may be given to you upon discharge.  Take your pain medication as prescribed, if you still have uncontrolled pain after taking acetaminophen (Tylenol) or ibuprofen (Advil). 3. Use ice packs to help control pain. 4. If you need a refill on your pain medication, please contact your pharmacy.  They will contact our office to request authorization. Prescriptions will not be filled after 5pm or on week-ends.  HOME MEDICATIONS 5. Take your usually prescribed medications unless otherwise directed.  DIET 6. You should follow a light diet the first few days after arrival home.  Be sure to include lots of fluids daily. Avoid fatty, fried foods.   CONSTIPATION 7. It is common to experience some constipation after surgery and if you are taking pain medication.  Increasing fluid intake and taking a stool softener (such as Colace) will usually help or prevent this problem from occurring.  A mild laxative (Milk of Magnesia or Miralax) should be taken according to package instructions if there are no bowel  movements after 48 hours.  WOUND/INCISION CARE 8. Most patients will experience some swelling and bruising in the area of the incisions.  Ice packs will help.  Swelling and bruising can take several days to resolve.  9. Unless discharge instructions indicate otherwise, follow guidelines below  a. STERI-STRIPS - you may remove your outer bandages 48 hours after surgery, and you may shower at that time.  You have steri-strips (small skin tapes) in place directly over the incision.  These strips should be left on the skin for 7-10 days.   b. DERMABOND/SKIN GLUE - you may shower in 24 hours.  The glue will flake off over the next 2-3 weeks. 10. Any sutures or staples will be removed at the office during your follow-up visit.  ACTIVITIES 11. You may resume regular (light) daily activities beginning the next day--such as daily self-care, walking, climbing stairs--gradually increasing activities as tolerated.  You may have sexual intercourse when it is comfortable.  Refrain from any heavy lifting or straining until approved by your doctor. a. You may drive when you are no longer taking prescription pain medication, you can comfortably wear a seatbelt, and you can safely maneuver your car and apply brakes.  FOLLOW-UP 12. You should see your doctor in the office for a follow-up appointment approximately 2-3 weeks after your surgery.  You should have been given your post-op/follow-up appointment when your surgery was scheduled.  If you did not receive a post-op/follow-up appointment, make sure that you call for this appointment within a day or two after you arrive home to insure a convenient appointment time.     WHEN TO CALL YOUR DOCTOR: 1. Fever over 101.0 2. Inability to urinate 3. Continued bleeding from incision. 4. Increased pain, redness, or drainage from the incision. 5. Increasing abdominal pain  The clinic staff is available to answer your questions during regular business hours.  Please don't  hesitate to call and ask to speak to one of the nurses for clinical concerns.  If you have a medical emergency, go to the nearest emergency room or call 911.  A surgeon from [facility] is always on call at the hospital. [address], [address], [place], [place]  [address] ? [address] [address], [place],    [address] [phone] ? [phone] ? FAX [phone] Web site: [email or web address]  .........   Managing Your Pain After Surgery Without Opioids    Thank you for participating in our program to help patients manage their pain after surgery without opioids. This is part of our effort to provide you with the best care possible, without exposing you or your family to the risk that opioids pose.  What pain can I expect after surgery? You can expect to have some pain after surgery. This is normal. The pain is typically worse the day after surgery, and quickly begins to get better. Many studies have found that many patients are able to manage their pain after surgery with Over-the-Counter (OTC) medications such as Tylenol and Motrin. If you have a condition that does not allow you to take Tylenol or Motrin, notify your surgical team.  How will I manage my pain? The best strategy for controlling your pain after surgery is around the clock pain control with Tylenol (acetaminophen) and Motrin (ibuprofen or Advil). Alternating these medications with each other allows you to maximize your pain control. In addition to Tylenol and Motrin, you can use heating pads or ice packs on your incisions to help reduce your pain.  How will I alternate your regular strength over-the-counter pain medication? You will take a dose of pain medication every three hours. ; Start by taking 650 mg of Tylenol (2 pills of 325 mg) ; 3 hours later take 600 mg of Motrin (3 pills of 200 mg) ; 3 hours after taking the Motrin take 650 mg of Tylenol ; 3 hours after that take 600 mg of  Motrin.   - 1 -  See example - if your first dose of Tylenol is at 12:00 PM   12:00 PM Tylenol 650 mg (2 pills of 325 mg)  3:00 PM Motrin 600 mg (3 pills of 200 mg)  6:00 PM Tylenol 650 mg (2 pills of 325 mg)  9:00 PM Motrin 600 mg (3 pills of 200 mg)  Continue alternating every 3 hours   We recommend that you follow this schedule around-the-clock for at least 3 days after surgery, or until you feel that it is no longer needed. Use the table on the last page of this handout to keep track of the medications you are taking. Important: Do not take more than 3000mg of Tylenol or 3200mg of Motrin in a 24-hour period. Do not take ibuprofen/Motrin if you have a history of bleeding stomach ulcers, severe kidney disease, &/or actively taking a blood thinner  What if I still have pain? If you have pain that is not controlled with the over-the-counter pain medications (Tylenol and Motrin or Advil) you might have what we call "breakthrough" pain. You will receive a prescription for a small amount of an opioid pain medication such as   Oxycodone, Tramadol, or Tylenol with Codeine. Use these opioid pills in the first 24 hours after surgery if you have breakthrough pain. Do not take more than 1 pill every 4-6 hours.  If you still have uncontrolled pain after using all opioid pills, don't hesitate to call our staff using the number provided. We will help make sure you are managing your pain in the best way possible, and if necessary, we can provide a prescription for additional pain medication.   Day 1    Time  Name of Medication Number of pills taken  Amount of Acetaminophen  Pain Level   Comments  AM PM       AM PM       AM PM       AM PM       AM PM       AM PM       AM PM       AM PM       Total Daily amount of Acetaminophen Do not take more than  3,000 mg per day      Day 2    Time  Name of Medication Number of pills taken  Amount of Acetaminophen  Pain Level   Comments  AM  PM       AM PM       AM PM       AM PM       AM PM       AM PM       AM PM       AM PM       Total Daily amount of Acetaminophen Do not take more than  3,000 mg per day      Day 3    Time  Name of Medication Number of pills taken  Amount of Acetaminophen  Pain Level   Comments  AM PM       AM PM       AM PM       AM PM          AM PM       AM PM       AM PM       AM PM       Total Daily amount of Acetaminophen Do not take more than  3,000 mg per day      Day 4    Time  Name of Medication Number of pills taken  Amount of Acetaminophen  Pain Level   Comments  AM PM       AM PM       AM PM       AM PM       AM PM       AM PM       AM PM       AM PM       Total Daily amount of Acetaminophen Do not take more than  3,000 mg per day      Day 5    Time  Name of Medication Number of pills taken  Amount of Acetaminophen  Pain Level   Comments  AM PM       AM PM       AM PM       AM PM       AM PM       AM PM       AM PM         AM PM       Total Daily amount of Acetaminophen Do not take more than  3,000 mg per day       Day 6    Time  Name of Medication Number of pills taken  Amount of Acetaminophen  Pain Level  Comments  AM PM       AM PM       AM PM       AM PM       AM PM       AM PM       AM PM       AM PM       Total Daily amount of Acetaminophen Do not take more than  3,000 mg per day      Day 7    Time  Name of Medication Number of pills taken  Amount of Acetaminophen  Pain Level   Comments  AM PM       AM PM       AM PM       AM PM       AM PM       AM PM       AM PM       AM PM       Total Daily amount of Acetaminophen Do not take more than  3,000 mg per day        For additional information about how and where to safely dispose of unused opioid medications - https://www.morepowerfulnc.org  Disclaimer: This document contains information and/or instructional materials adapted from [facility]  for the typical patient with your condition. It does not replace medical advice from your health care provider because your experience may differ from that of the typical patient. Talk to your health care provider if you have any questions about this document, your condition or your treatment plan. Adapted from [facility]  

## 2019-08-19 NOTE — Discharge Summary (Addendum)
[facility] Discharge Summary   Patient ID: Austin Burke MRN: [id number] DOB/AGE: [birth year] 54 y.o.  Admit date: 08/18/2019 Discharge date: 08/19/2019  Admitting Diagnosis: Acute appendicitis  Discharge Diagnosis Patient Active Problem List   Diagnosis Date Noted   Appendicitis 08/18/2019    Consultants None   Imaging: CT ABDOMEN PELVIS W CONTRAST  Result Date: 08/18/2019 CLINICAL DATA:  Abdominal abscess or infection suspected. Right-sided abdominal pain. EXAM: CT ABDOMEN AND PELVIS WITH CONTRAST TECHNIQUE: Multidetector CT imaging of the abdomen and pelvis was performed using the standard protocol following bolus administration of intravenous contrast. CONTRAST:  12mL OMNIPAQUE IOHEXOL 300 MG/ML  SOLN COMPARISON:  None. FINDINGS: Lower chest: The lung bases are clear. The heart size is normal. Hepatobiliary: The liver is normal. Normal gallbladder.There is no biliary ductal dilation. Pancreas: Normal contours without ductal dilatation. No peripancreatic fluid collection. Spleen: Unremarkable. Adrenals/Urinary Tract: --Adrenal glands: Unremarkable. --Right kidney/ureter: There is a nonobstructing 2-3 mm stone in the interpolar region of the right kidney. --Left kidney/ureter: No hydronephrosis or radiopaque kidney stones. --Urinary bladder: Unremarkable. Stomach/Bowel: --Stomach/Duodenum: No hiatal hernia or other gastric abnormality. Normal duodenal course and caliber. --Small bowel: Unremarkable. --Colon: Unremarkable. --Appendix: The appendix is dilated measuring up to approximately 1.3 cm (axial series 2, image 53). There are adjacent periappendiceal inflammatory changes. There is no fluid collection or extraluminal air Vascular/Lymphatic: Normal course and caliber of the major abdominal vessels. --No retroperitoneal lymphadenopathy. --No mesenteric lymphadenopathy. --No pelvic or inguinal lymphadenopathy. Reproductive: Incidentally noted are bilateral hydroceles, left  greater than right. Other: No ascites or free air. The abdominal wall is normal. Musculoskeletal. No acute displaced fractures. IMPRESSION: 1. Acute uncomplicated appendicitis. 2. Incidentally noted bilateral hydroceles, left greater than right. 3. Nonobstructive right nephrolithiasis. Electronically Signed   By: Constance Holster M.D.   On: 08/18/2019 17:48   US Abdomen Limited RUQ  Result Date: 08/18/2019 CLINICAL DATA:  Pain EXAM: ULTRASOUND ABDOMEN LIMITED RIGHT UPPER QUADRANT COMPARISON:  None. 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 There are no gallstones. The sonographic Percell Miller sign is negative. There is no gallbladder wall thickening. Common bile duct: Diameter: 4 mm Liver: No focal lesion identified. Within normal limits in parenchymal echogenicity. Portal vein is patent on color Doppler imaging with normal direction of blood flow towards the liver. Other: None. IMPRESSION: No acute abnormality. No evidence for cholelithiasis or acute cholecystitis. Electronically Signed   By: Constance Holster M.D.   On: 08/18/2019 16:48    Procedures Dr. Marlou Starks (08/18/19) - Laparoscopic Appendectomy  Hospital Course:  Patient is a healthy 54  who presented to [facility] with abdominal pain.  Workup showed acute appendicitis.  Patient was admitted and underwent procedure listed above.  Tolerated procedure well and was transferred to the floor.  Diet was advanced as tolerated.  On POD#1, the patient was voiding well, tolerating diet, ambulating well, pain well controlled, vital signs stable, incisions c/d/i and felt stable for discharge home.  Patient will follow up in our office in 3 weeks and knows to call with questions or concerns.  He will call to confirm appointment date/time.    Physical Exam: General:  Alert, NAD, pleasant, comfortable Abd:  Soft, ND, mild tenderness, incisions C/D/I   I or a member of my team have reviewed this patient in the Controlled Substance  Database.   Allergies as of 08/19/2019   No Known Allergies      Medication List     TAKE these medications    HYDROcodone-acetaminophen 5-325 MG tablet  Commonly known as: NORCO/VICODIN Take 1-2 tablets by mouth every 6 (six) hours as needed for moderate pain.         Follow-up Information     [facility], [facility]. Go on 09/06/2019.   Specialty: General Surgery Why: Follow up appointment scheduled for 10:15 AM. Please arrive 30 min prior to appointment time. Bring photo ID and insurance information with you.  Contact information: [address] [place] [address] [phone]           Signed: Wells Guiles, [facility] 08/19/2019, 9:33 AM Please see Amion for pager number during day hours 7:00am-4:30pm  Agree with above.  Ovidio Kin, MD, [facility] Office phone:  [phone]

## 2019-08-19 NOTE — Progress Notes (Signed)
Patient discharged to home w/ family. Given all belongings, instructions, work Physicist, medical. Verbalized understanding of instructions. Escorted to pov via w/c.

## 2019-08-23 ENCOUNTER — Encounter: Payer: Self-pay | Admitting: Emergency Medicine

## 2019-08-23 LAB — SURGICAL PATHOLOGY

## 2020-03-30 ENCOUNTER — Other Ambulatory Visit: Payer: 59 | Admitting: Internal Medicine

## 2020-03-30 DIAGNOSIS — Z0000 Encounter for general adult medical examination without abnormal findings: Secondary | ICD-10-CM

## 2020-03-30 DIAGNOSIS — Z125 Encounter for screening for malignant neoplasm of prostate: Secondary | ICD-10-CM

## 2020-03-30 DIAGNOSIS — Z13220 Encounter for screening for lipoid disorders: Secondary | ICD-10-CM

## 2020-04-02 ENCOUNTER — Encounter: Payer: 59 | Admitting: Internal Medicine

## 2020-05-13 ENCOUNTER — Encounter (HOSPITAL_COMMUNITY): Payer: Self-pay | Admitting: Emergency Medicine

## 2020-05-13 ENCOUNTER — Ambulatory Visit (HOSPITAL_COMMUNITY)
Admission: EM | Admit: 2020-05-13 | Discharge: 2020-05-13 | Disposition: A | Discharge status: Home / Self Care | Payer: 59 | Attending: Emergency Medicine | Admitting: Emergency Medicine

## 2020-05-13 ENCOUNTER — Other Ambulatory Visit: Payer: Self-pay

## 2020-05-13 DIAGNOSIS — H1132 Conjunctival hemorrhage, left eye: Secondary | ICD-10-CM

## 2020-05-13 DIAGNOSIS — L259 Unspecified contact dermatitis, unspecified cause: Secondary | ICD-10-CM | POA: Diagnosis not present

## 2020-05-13 MED ORDER — HYDROCORTISONE 2.5 % EX OINT: TOPICAL_OINTMENT | Freq: Two times a day (BID) | CUTANEOUS | 0 refills | Status: AC

## 2020-05-13 NOTE — Discharge Instructions (Addendum)
Cool compresses, hydrocortisone ointment twice a day as needed. Stop the ointment when your symptoms resolve. Stop the soap that you have been using. Try Burts bees sensitive face wash cream.

## 2020-05-13 NOTE — ED Provider Notes (Addendum)
HPI  SUBJECTIVE:  Austin Burke is a 55 y.o. male who presents with bilateral erythematous, dry, scaly skin underneath his eyes and fine wrinkles.  States that he woke up with it.  No localized swelling, pain, pain with extraocular movements, sun exposure, fevers, double vision, visual loss, blurry vision, headache, fever, itching.  He has been using a new soap on his face over the past 2 months.  No new lotions, detergents, known chemical exposure, other new face washes, known exposure to poison ivy or poison oak.  He tried using OTC cortisone yesterday with some improvement in symptoms.  No aggravating factors.  He does not take any medications on a regular basis.  No history of eczema, allergies, diabetes.  He also notes a left lateral subconjunctival hemorrhage today.  No eye pain.  PMD: Dr. Lenord Fellers    Past Medical History:  Diagnosis Date  . Medical history non-contributory     Past Surgical History:  Procedure Laterality Date  . KNEE SURGERY     left in 1987  . LAPAROSCOPIC APPENDECTOMY N/A 08/18/2019   Procedure: APPENDECTOMY LAPAROSCOPIC;  Surgeon: Chevis Pretty III, MD;  Location: [facility];  Service: General;  Laterality: N/A;  . left knee surgery Left    age 26 to 21 "knee cap knocked off and had surgery"  . scrotal area - cyst removed from left side      Family History  Problem Relation Age of Onset  . COPD Father     Social History   Tobacco Use  . Smoking status: Former Smoker    Types: Cigarettes    Quit date: 08/17/2012    Years since quitting: 7.7  . Smokeless tobacco: Former Neurosurgeon    Types: Snuff    Quit date: 08/18/2015  Vaping Use  . Vaping Use: Every day  . Start date: 08/17/2016  Substance Use Topics  . Alcohol use: Never    Comment: rarely  . Drug use: Never    Comment: steroids     Current Facility-Administered Medications:  .  TDaP (BOOSTRIX) injection 0.5 mL, 0.5 mL, Intramuscular, Once, Baxley, Luanna Cole, MD  Current Outpatient Medications:   .  hydrocortisone 2.5 % ointment, Apply topically 2 (two) times daily for 14 days., Disp: 30 g, Rfl: 0 .  Multiple Vitamin (MULTIVITAMIN) tablet, Take 1 tablet by mouth daily., Disp: , Rfl:   No Known Allergies   ROS  As noted in HPI.   Physical Exam  BP 116/65   Pulse 75   Temp (!) 97.5 F (36.4 C)   Resp 18   SpO2 98%   Constitutional: Well developed, well nourished, no acute distress Eyes:  EOMI, PERRLA, no direct or consensual photophobia, subconjunctival hemorrhage L lateral eye.  HENT: Normocephalic, atraumatic,mucus membranes moist Respiratory: Normal inspiratory effort Cardiovascular: Normal rate GI: nondistended skin: dry scaly skin with fine wrinkles inferior to both eyes. No tenderness, erythema, swelling, excoriations. Skin intact. Dry skin left supraorbital region.    Musculoskeletal: no deformities Neurologic: Alert & oriented x 3, no focal neuro deficits Psychiatric: Speech and behavior appropriate   ED Course   Medications - No data to display  No orders of the defined types were placed in this encounter.   No results found for this or any previous visit (from the past 24 hour(s)). No results found.  ED Clinical Impression  1. Contact dermatitis, unspecified contact dermatitis type, unspecified trigger   2. Subconjunctival hemorrhage of left eye      ED  Assessment/Plan  1. Dry skin-could be a contact dermatitis from the soap that he has been using. No evidence of infection. He states that he is not changed face washes or lotions recently. Advised him to stop using the soap, switch to a gentle nonsoap based facial cleanser, and will send home with steroid ointment. Cool compresses.  2. Left subconjunctival hemorrhage.  Supportive care. Discussed this with patient.  Discussed MDM, treatment plan, and plan for follow-up with patient.  patient agrees with plan.   Meds ordered this encounter  Medications  . hydrocortisone 2.5 % ointment     Sig: Apply topically 2 (two) times daily for 14 days.    Dispense:  30 g    Refill:  0    *This clinic note was created using Scientist, clinical (histocompatibility and immunogenetics). Therefore, there may be occasional mistakes despite careful proofreading.   ?    Domenick Gong, MD 05/14/20 1726    Domenick Gong, MD 05/14/20 1726

## 2020-05-13 NOTE — ED Triage Notes (Signed)
Pt presents with c/o irritated eyes for past few days , left eye is blood shot

## 2020-11-29 IMAGING — CT CT ABD-PELV W/ CM
2 of 5 series · 16 of 46 positions shown, 18 images · IV contrast (Omnipaque)
Comparison: None.

CLINICAL DATA: Abdominal abscess or infection suspected.
Right-sided abdominal pain.

EXAM:
CT ABDOMEN AND PELVIS WITH CONTRAST
TECHNIQUE: Multidetector CT imaging of the abdomen and pelvis was performed
using the standard protocol following bolus administration of
intravenous contrast.
CONTRAST:  100mL OMNIPAQUE IOHEXOL 300 MG/ML  SOLN

[Series 2: axial st · axial · 0.72mm/px · z∈[-438,-58]mm · 13 of 86 slices shown, 15 images]
[im 5/86  soft-tissue]
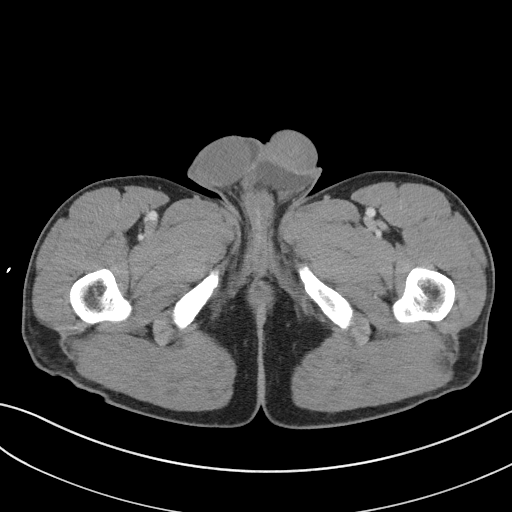
[im 5/86  bone]
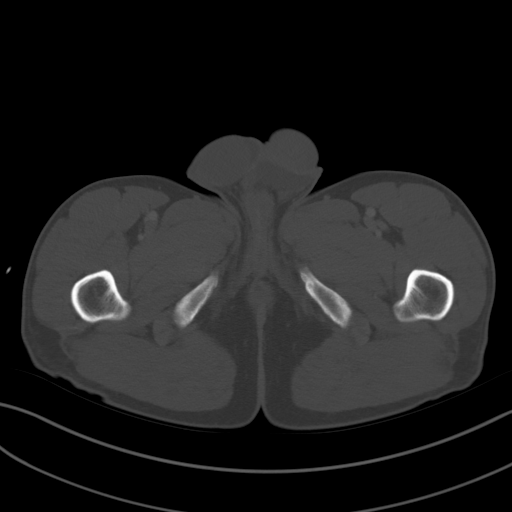
[im 10/86  soft-tissue]
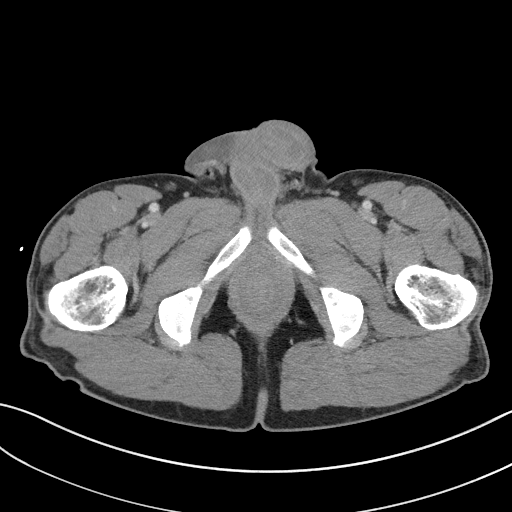
[im 19/86  soft-tissue]
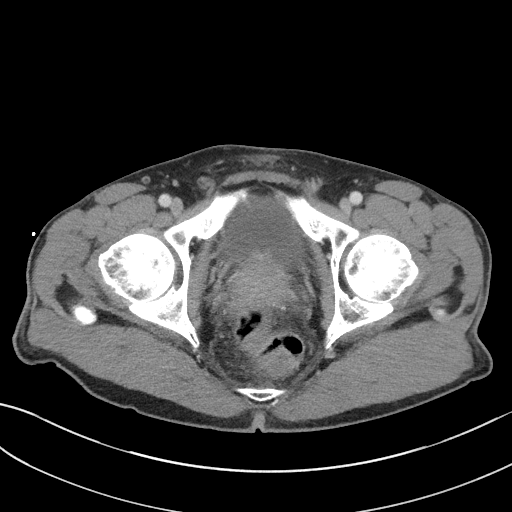
[im 24/86  soft-tissue]
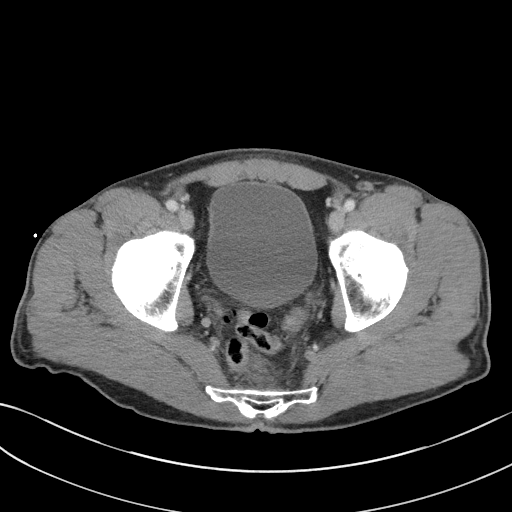
[im 29/86  soft-tissue]
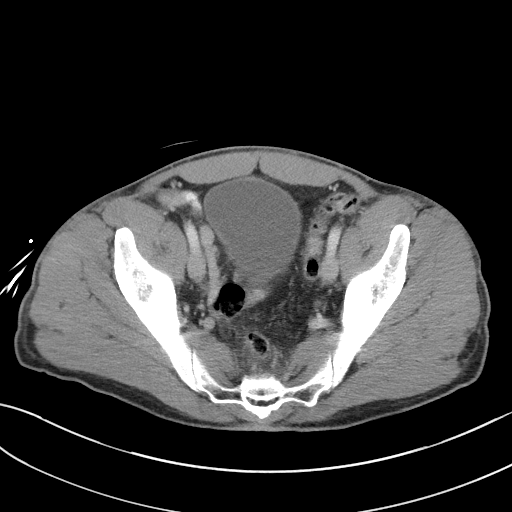
[im 38/86  soft-tissue]
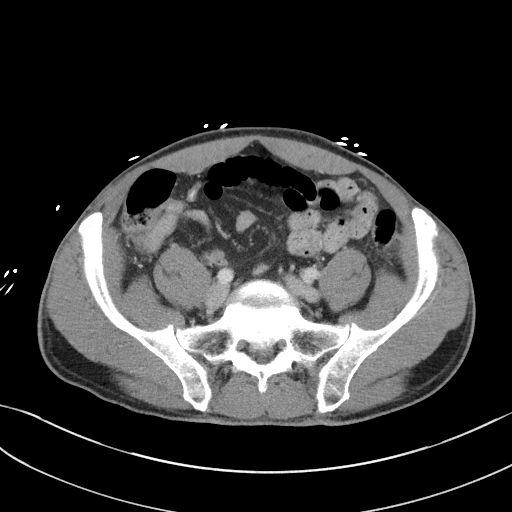
[im 43/86  soft-tissue]
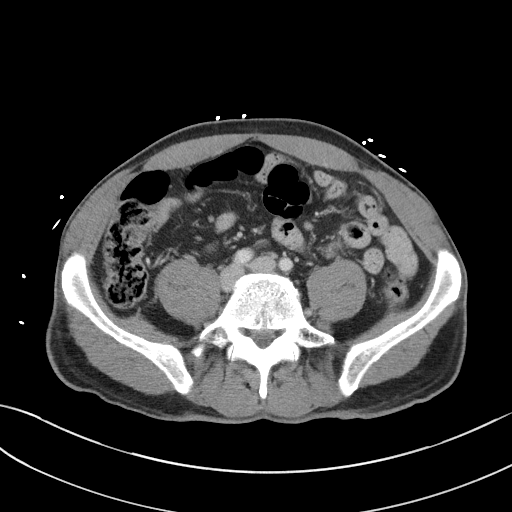
[im 48/86  soft-tissue]
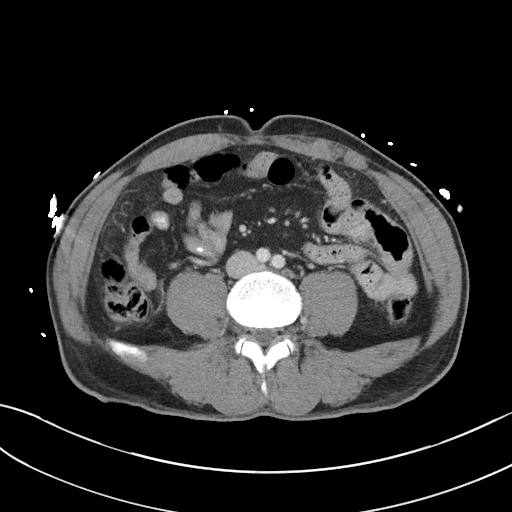
[im 57/86  soft-tissue]
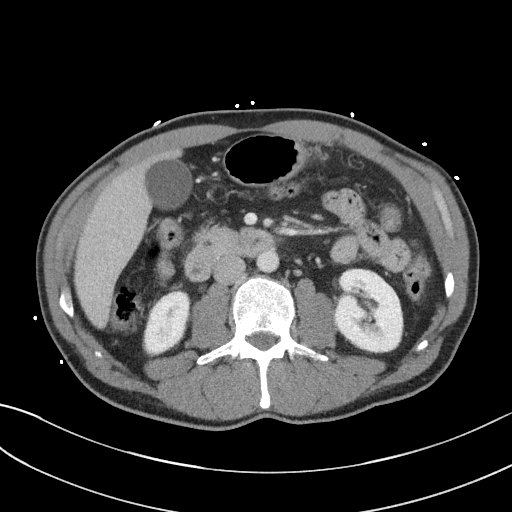
[im 57/86  bone]
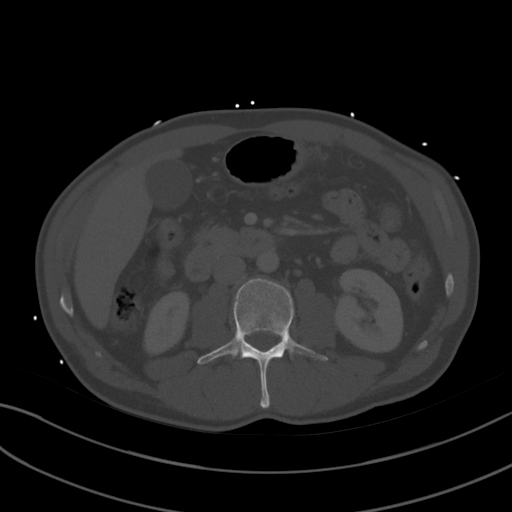
[im 62/86  soft-tissue]
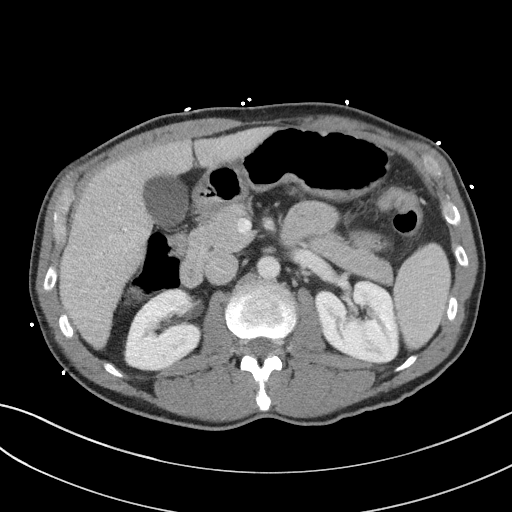
[im 67/86  soft-tissue]
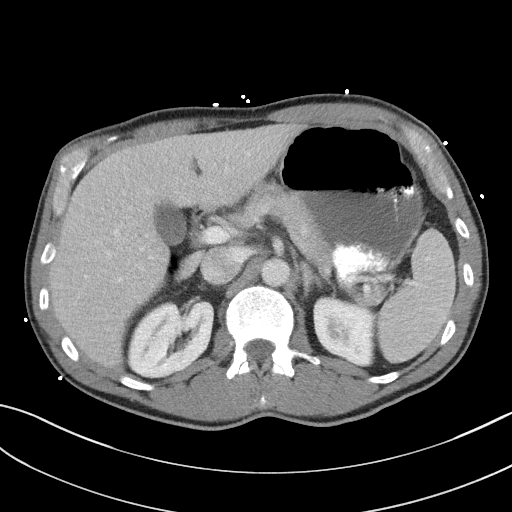
[im 76/86  soft-tissue]
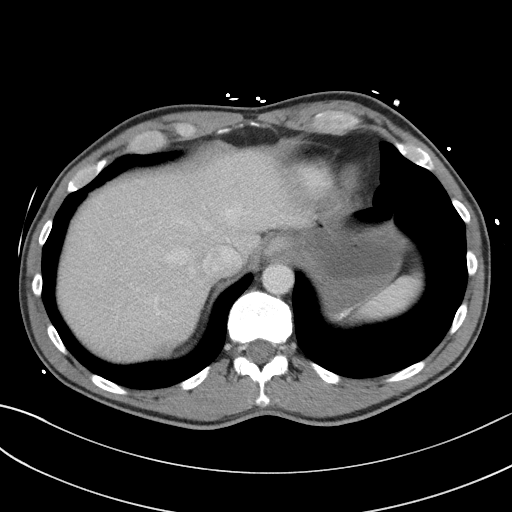
[im 81/86  soft-tissue]
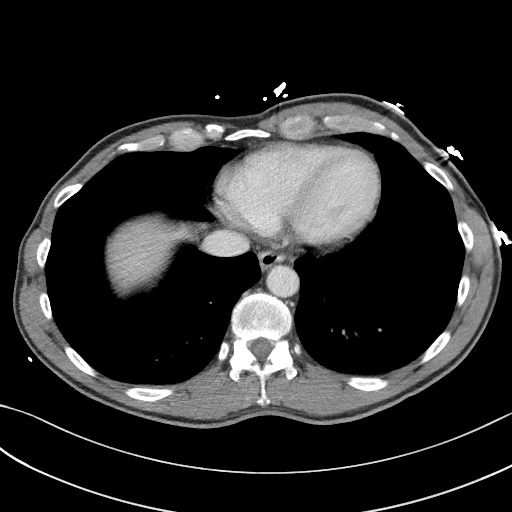

[Series 5: coronal st · coronal · 0.70mm/px · 3 of 94 slices shown]
[im 32/94  soft-tissue]
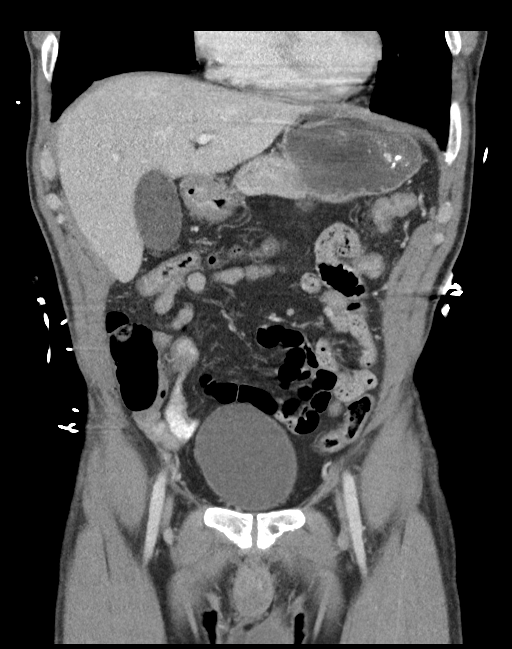
[im 42/94  soft-tissue]
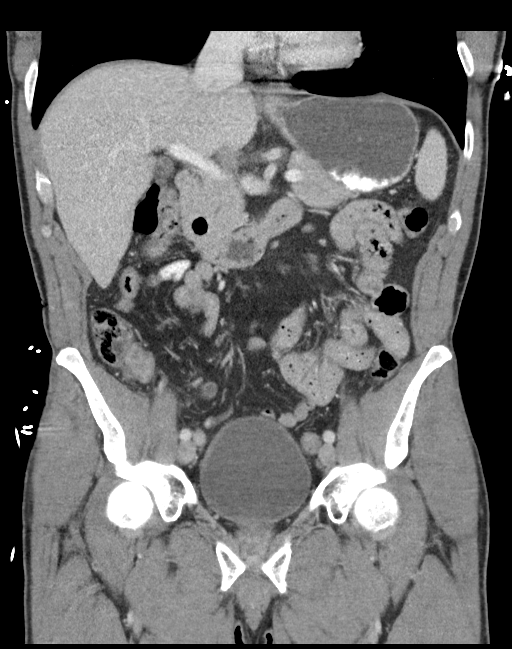
[im 52/94  soft-tissue]
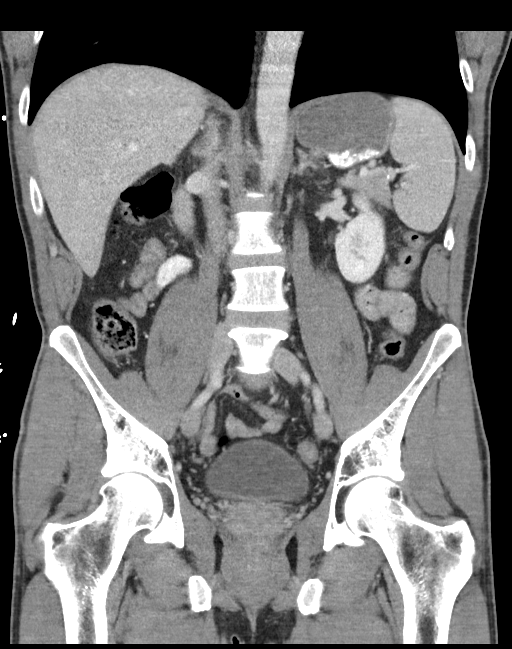

[16 of 46 positions shown; findings below may reference images not displayed]

FINDINGS: Lower chest: The lung bases are clear. The heart size is normal.

Hepatobiliary: The liver is normal. Normal gallbladder.There is no
biliary ductal dilation.

Pancreas: Normal contours without ductal dilatation. No
peripancreatic fluid collection.

Spleen: Unremarkable.

Adrenals/Urinary Tract:

--Adrenal glands: Unremarkable.

--Right kidney/ureter: There is a nonobstructing 2-3 mm stone in the
interpolar region of the right kidney.

--Left kidney/ureter: No hydronephrosis or radiopaque kidney stones.

--Urinary bladder: Unremarkable.

Stomach/Bowel:

--Stomach/Duodenum: No hiatal hernia or other gastric abnormality.
Normal duodenal course and caliber.

--Small bowel: Unremarkable.

--Colon: Unremarkable.

--Appendix: The appendix is dilated measuring up to approximately
1.3 cm (axial series 2, image 53). There are adjacent
periappendiceal inflammatory changes. There is no fluid collection
or extraluminal air

Vascular/Lymphatic: Normal course and caliber of the major abdominal
vessels.

--No retroperitoneal lymphadenopathy.

--No mesenteric lymphadenopathy.

--No pelvic or inguinal lymphadenopathy.

Reproductive: Incidentally noted are bilateral hydroceles, left
greater than right.

Other: No ascites or free air. The abdominal wall is normal.

Musculoskeletal. No acute displaced fractures.
IMPRESSION: 1. Acute uncomplicated appendicitis.
2. Incidentally noted bilateral hydroceles, left greater than right.
3. Nonobstructive right nephrolithiasis.

## 2020-11-29 IMAGING — US US ABDOMEN LIMITED
1 series · 14 of 25 positions shown · non-contrast
Comparison: None.

CLINICAL DATA: Pain

EXAM:
ULTRASOUND ABDOMEN LIMITED RIGHT UPPER QUADRANT

[Series 1: us abdomen limited · 14 of 62 slices shown]
[im 1/62]
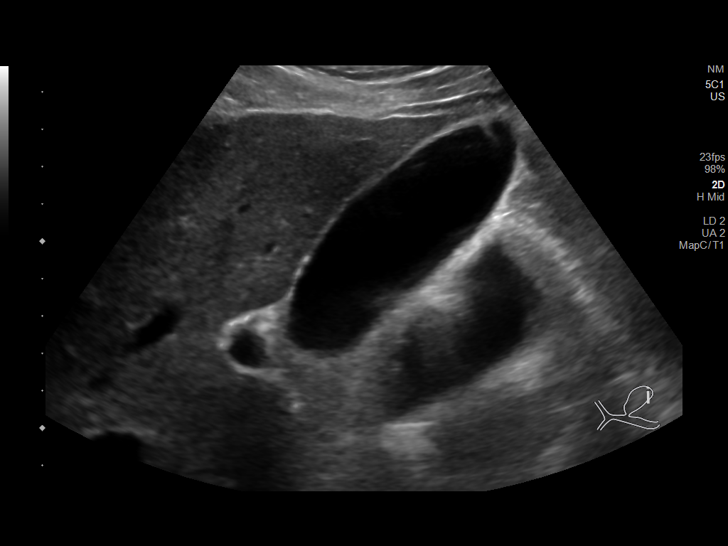
[im 6/62]
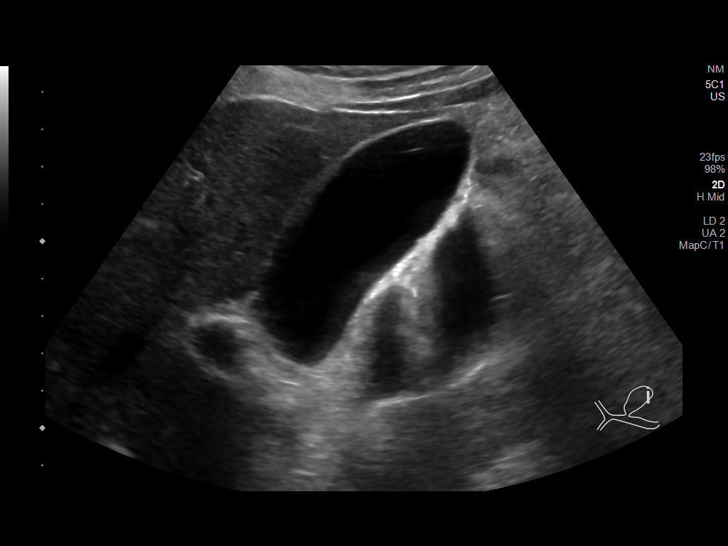
[im 11/62]
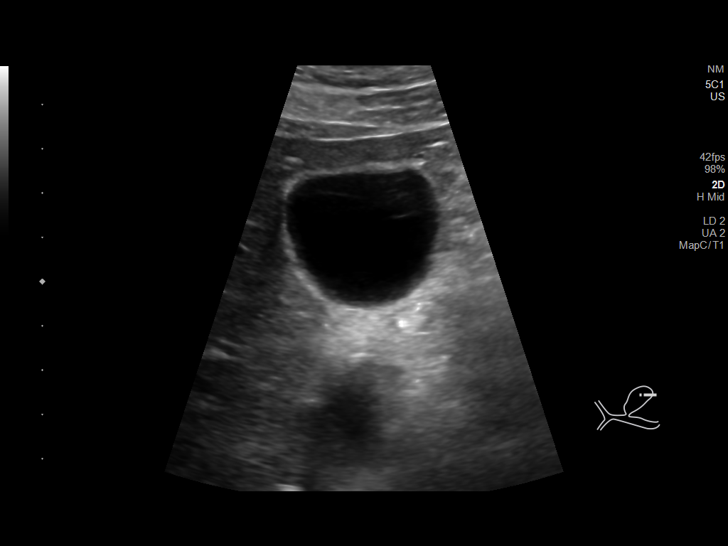
[im 16/62]
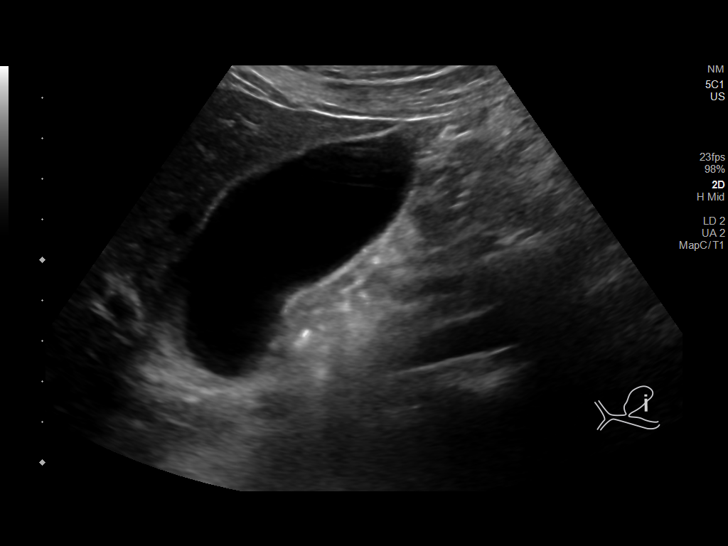
[im 21/62]
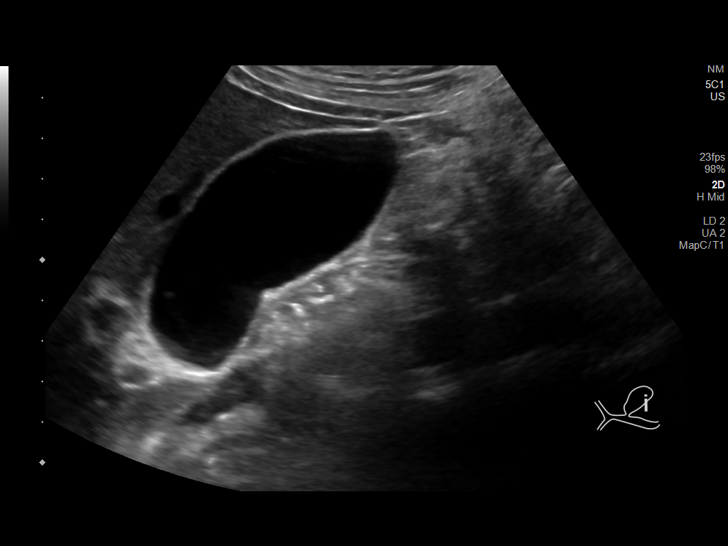
[im 23/62]
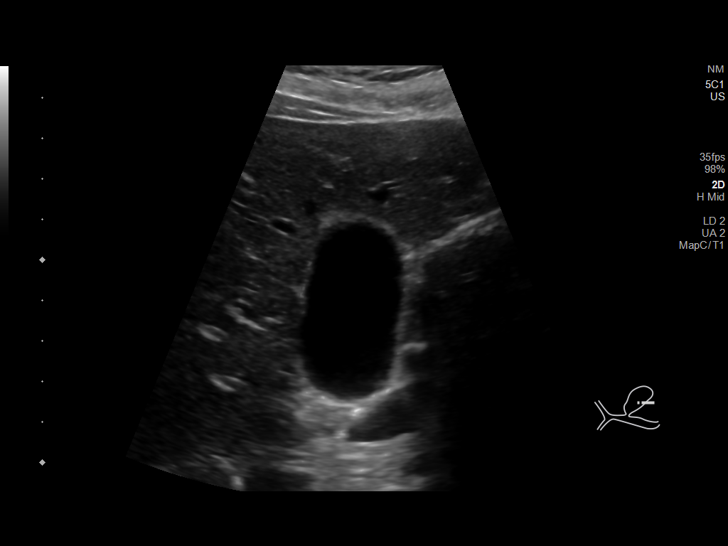
[im 28/62]
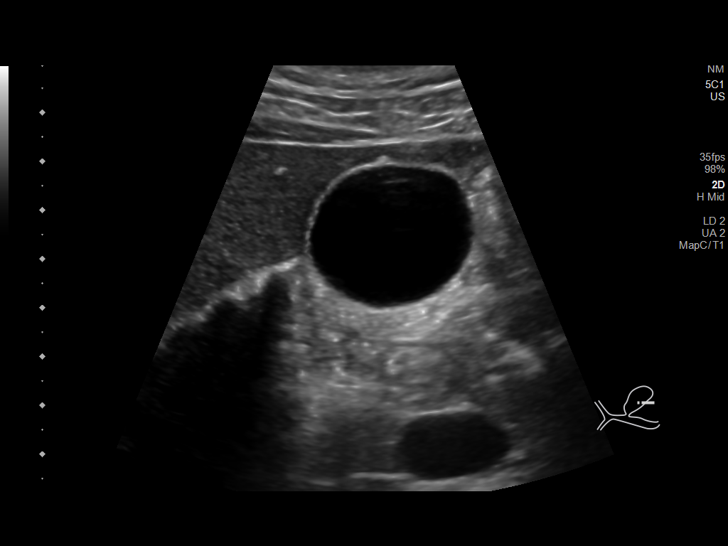
[im 34/62]
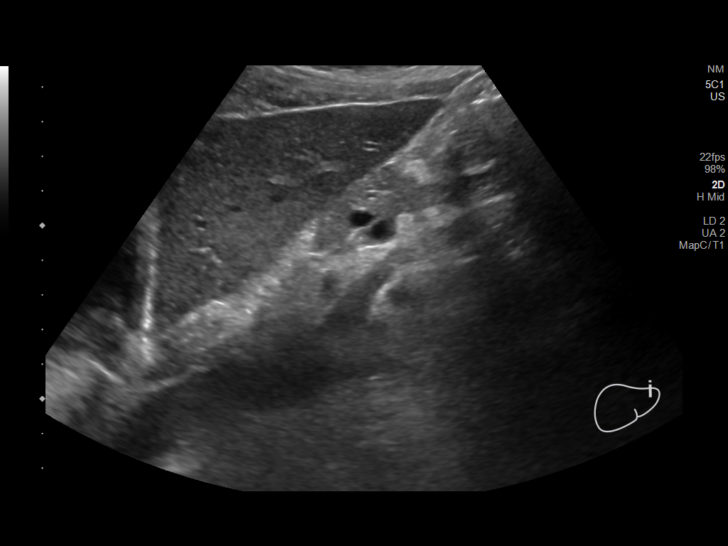
[im 39/62]
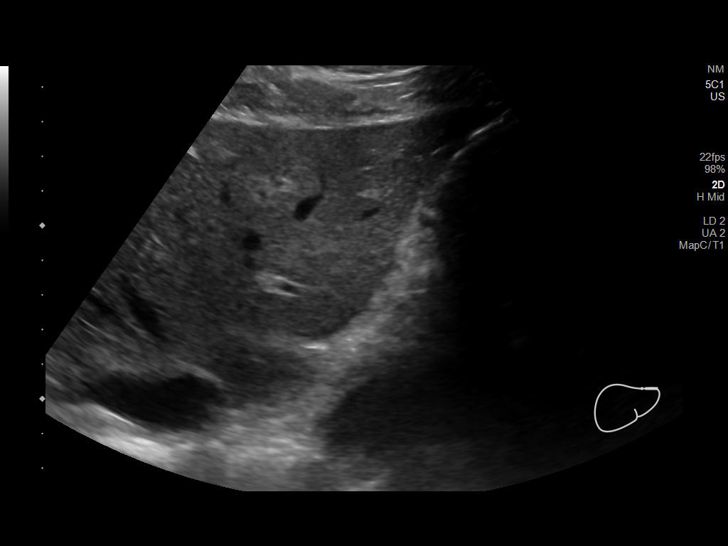
[im 41/62]
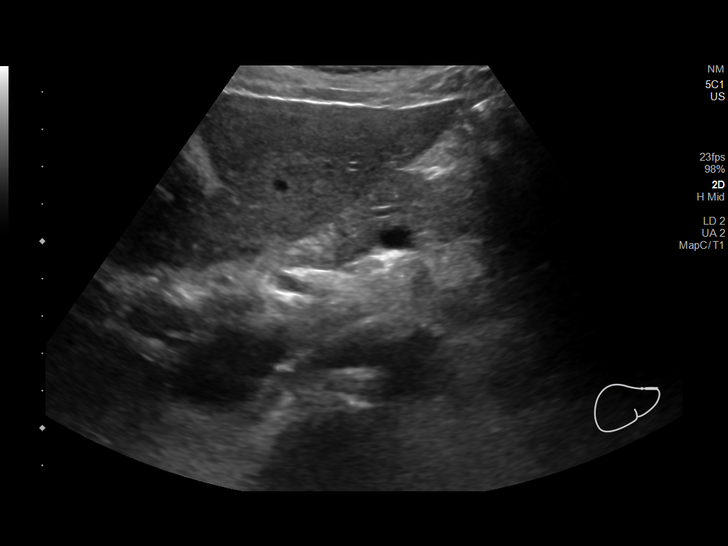
[im 46/62]
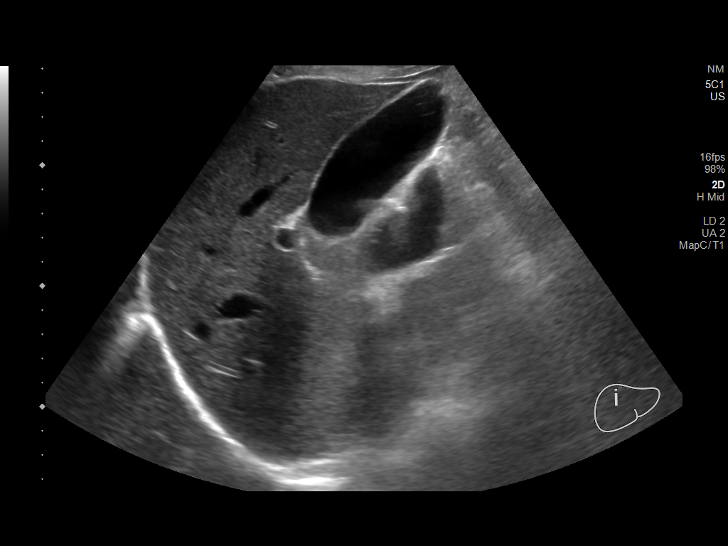
[im 51/62]
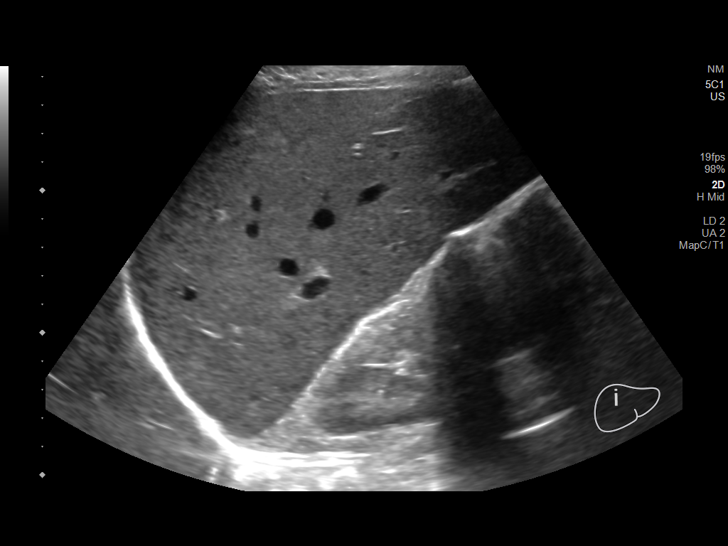
[im 56/62]
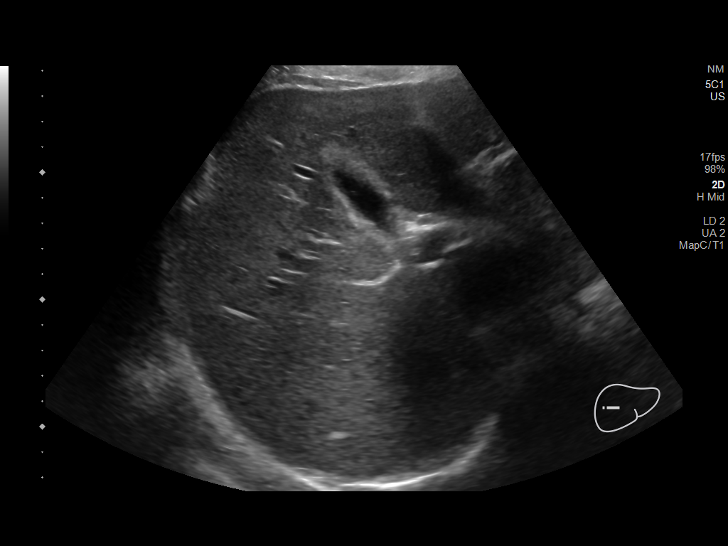
[im 62/62]
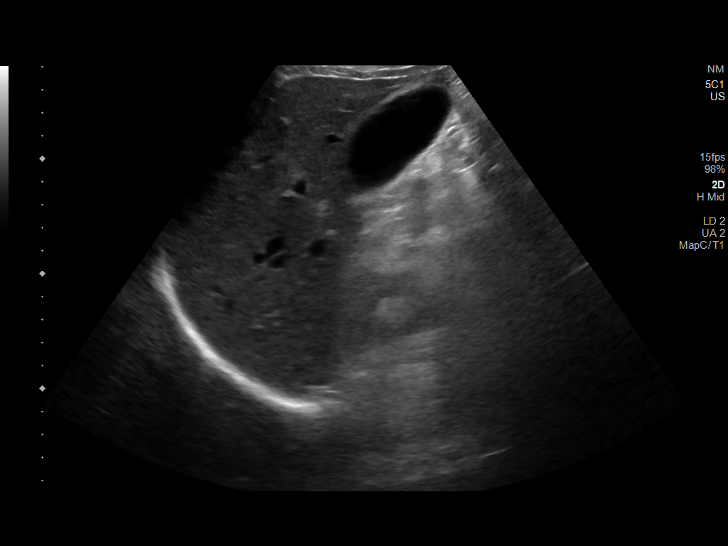

[14 of 25 positions shown; findings below may reference images not displayed]

FINDINGS: Gallbladder:

There is evidence for adenomyomatosis. There are no gallstones. The
sonographic Murphy sign is negative. There is no gallbladder wall
thickening.

Common bile duct:

Diameter: 4 mm

Liver:

No focal lesion identified. Within normal limits in parenchymal
echogenicity. Portal vein is patent on color Doppler imaging with
normal direction of blood flow towards the liver.

Other: None.
IMPRESSION: No acute abnormality. No evidence for cholelithiasis or acute
cholecystitis.

## 2021-03-25 ENCOUNTER — Other Ambulatory Visit: Payer: Self-pay

## 2021-03-25 ENCOUNTER — Ambulatory Visit
Admission: EM | Admit: 2021-03-25 | Discharge: 2021-03-25 | Disposition: A | Discharge status: Home / Self Care | Payer: 59 | Attending: Physician Assistant | Admitting: Physician Assistant

## 2021-03-25 DIAGNOSIS — M549 Dorsalgia, unspecified: Secondary | ICD-10-CM

## 2021-03-25 DIAGNOSIS — M79631 Pain in right forearm: Secondary | ICD-10-CM

## 2021-03-25 DIAGNOSIS — M79622 Pain in left upper arm: Secondary | ICD-10-CM

## 2021-03-25 MED ORDER — CYCLOBENZAPRINE HCL 10 MG PO TABS: 10.0000 mg | ORAL_TABLET | Freq: Two times a day (BID) | ORAL | 0 refills | Status: DC | PRN

## 2021-03-25 MED ORDER — PREDNISONE 20 MG PO TABS: 40.0000 mg | ORAL_TABLET | Freq: Every day | ORAL | 0 refills | Status: AC
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
## 2021-04-18 ENCOUNTER — Other Ambulatory Visit: Payer: 59 | Admitting: Internal Medicine

## 2021-04-18 ENCOUNTER — Other Ambulatory Visit: Payer: Self-pay

## 2021-04-18 DIAGNOSIS — Z125 Encounter for screening for malignant neoplasm of prostate: Secondary | ICD-10-CM

## 2021-04-18 DIAGNOSIS — Z13220 Encounter for screening for lipoid disorders: Secondary | ICD-10-CM

## 2021-04-18 DIAGNOSIS — Z0000 Encounter for general adult medical examination without abnormal findings: Secondary | ICD-10-CM

## 2021-04-18 LAB — CBC WITH DIFFERENTIAL/PLATELET
Absolute Monocytes: 470 cells/uL (ref 200–950)
Basophils Absolute: 49 cells/uL (ref 0–200)
Basophils Relative: 0.9 %
Eosinophils Absolute: 11 cells/uL — ABNORMAL LOW (ref 15–500)
Eosinophils Relative: 0.2 %
HCT: 47.3 % (ref 38.5–50.0)
Hemoglobin: 15.6 g/dL (ref 13.2–17.1)
Lymphs Abs: 1166 cells/uL (ref 850–3900)
MCH: 31.5 pg (ref 27.0–33.0)
MCHC: 33 g/dL (ref 32.0–36.0)
MCV: 95.4 fL (ref 80.0–100.0)
MPV: 9.4 fL (ref 7.5–12.5)
Monocytes Relative: 8.7 %
Neutro Abs: 3704 cells/uL (ref 1500–7800)
Neutrophils Relative %: 68.6 %
Platelets: 293 10*3/uL (ref 140–400)
RBC: 4.96 10*6/uL (ref 4.20–5.80)
RDW: 11.1 % (ref 11.0–15.0)
Total Lymphocyte: 21.6 %
WBC: 5.4 10*3/uL (ref 3.8–10.8)

## 2021-04-18 LAB — COMPLETE METABOLIC PANEL WITH GFR
AG Ratio: 1.8 (calc) (ref 1.0–2.5)
ALT: 14 U/L (ref 9–46)
AST: 12 U/L (ref 10–35)
Albumin: 4.4 g/dL (ref 3.6–5.1)
Alkaline phosphatase (APISO): 75 U/L (ref 35–144)
BUN: 8 mg/dL (ref 7–25)
CO2: 32 mmol/L (ref 20–32)
Calcium: 9.8 mg/dL (ref 8.6–10.3)
Chloride: 104 mmol/L (ref 98–110)
Creat: 0.98 mg/dL (ref 0.70–1.30)
Globulin: 2.4 g/dL (calc) (ref 1.9–3.7)
Glucose, Bld: 87 mg/dL (ref 65–99)
Potassium: 4.8 mmol/L (ref 3.5–5.3)
Sodium: 141 mmol/L (ref 135–146)
Total Bilirubin: 0.6 mg/dL (ref 0.2–1.2)
Total Protein: 6.8 g/dL (ref 6.1–8.1)
eGFR: 92 mL/min/{1.73_m2} (ref >=60)

## 2021-04-18 LAB — PSA: PSA: 0.71 ng/mL (ref <=4.00)

## 2021-04-18 LAB — LIPID PANEL
Cholesterol: 165 mg/dL (ref <200)
HDL: 48 mg/dL (ref >=40)
LDL Cholesterol (Calc): 94 mg/dL (calc)
Non-HDL Cholesterol (Calc): 117 mg/dL (calc) (ref <130)
Total CHOL/HDL Ratio: 3.4 (calc) (ref <5.0)
Triglycerides: 130 mg/dL (ref <150)

## 2021-04-19 ENCOUNTER — Ambulatory Visit (INDEPENDENT_AMBULATORY_CARE_PROVIDER_SITE_OTHER): Payer: 59 | Admitting: Internal Medicine

## 2021-04-19 ENCOUNTER — Encounter: Payer: Self-pay | Admitting: Internal Medicine

## 2021-04-19 VITALS — BP 120/84 | HR 76 | Temp 98.9°F | Ht 70.0 in | Wt 168.2 lb

## 2021-04-19 DIAGNOSIS — Z1211 Encounter for screening for malignant neoplasm of colon: Secondary | ICD-10-CM
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
## 2021-04-22 ENCOUNTER — Encounter: Payer: Self-pay | Admitting: Internal Medicine

## 2021-04-22 NOTE — Patient Instructions (Addendum)
Referral for screening colonoscopy.  Fasting labs are within normal limits.  Return in 1 year for health maintenance exam or as needed.  It was a pleasure to see you today.

## 2022-04-18 ENCOUNTER — Telehealth: Payer: Self-pay | Admitting: Internal Medicine

## 2022-04-18 ENCOUNTER — Encounter: Payer: Self-pay | Admitting: Internal Medicine

## 2022-04-18 ENCOUNTER — Other Ambulatory Visit: Payer: Self-pay

## 2022-04-18 DIAGNOSIS — Z125 Encounter for screening for malignant neoplasm of prostate: Secondary | ICD-10-CM

## 2022-04-18 DIAGNOSIS — Z136 Encounter for screening for cardiovascular disorders: Secondary | ICD-10-CM

## 2022-04-18 DIAGNOSIS — Z13220 Encounter for screening for lipoid disorders: Secondary | ICD-10-CM

## 2022-04-18 NOTE — Telephone Encounter (Signed)
LVM that patient had missed lab appointment

## 2022-04-18 NOTE — Progress Notes (Shared)
Patient Care Team: Elby Showers, MD as PCP - General (Internal Medicine)  Visit Date: 04/18/22  Subjective:    Patient ID: Austin Burke , Male   DOB: 1965/10/04, 57 y.o.    MRN: [id number]   57 y.o. Male presents today for annual comprehensive physical exam. Patient has a past medical history of internal hemorrhoids, bronchitis.    Family history: Father with history of COPD and hypertension.  2 brothers in good health.   Past Medical History:  Diagnosis Date   Medical history non-contributory      Family History  Problem Relation Age of Onset   COPD Father     Social History   Social History Narrative   ** Merged History Encounter **          Review of Systems  Constitutional:  Negative for chills, fever, malaise/fatigue and weight loss.  HENT:  Negative for hearing loss, sinus pain and sore throat.   Respiratory:  Negative for cough and hemoptysis.   Cardiovascular:  Negative for chest pain, palpitations, leg swelling and PND.  Gastrointestinal:  Negative for abdominal pain, constipation, diarrhea, heartburn, nausea and vomiting.  Genitourinary:  Negative for dysuria, frequency and urgency.  Musculoskeletal:  Negative for back pain, myalgias and neck pain.  Skin:  Negative for itching and rash.  Neurological:  Negative for dizziness, tingling, seizures and headaches.  Endo/Heme/Allergies:  Negative for polydipsia.  Psychiatric/Behavioral:  Negative for depression. The patient is not nervous/anxious.         Objective:   Vitals: There were no vitals taken for this visit.   Physical Exam Vitals and nursing note reviewed. Exam conducted with a chaperone present.  Constitutional:      General: He is awake. He is not in acute distress.    Appearance: Normal appearance. He is not ill-appearing or toxic-appearing.  HENT:     Head: Normocephalic and atraumatic.     Right Ear: Tympanic membrane, ear canal and external ear normal.     Left Ear:  Tympanic membrane, ear canal and external ear normal.     Mouth/Throat:     Pharynx: Oropharynx is clear.  Eyes:     Extraocular Movements: Extraocular movements intact.     Pupils: Pupils are equal, round, and reactive to light.  Neck:     Thyroid: No thyroid mass, thyromegaly or thyroid tenderness.     Vascular: No carotid bruit.  Cardiovascular:     Rate and Rhythm: Normal rate and regular rhythm. No extrasystoles are present.    Pulses: Normal pulses.          Dorsalis pedis pulses are 2+ on the right side and 2+ on the left side.       Posterior tibial pulses are 2+ on the right side and 2+ on the left side.     Heart sounds: Normal heart sounds. No murmur heard.    No friction rub. No gallop.  Pulmonary:     Effort: Pulmonary effort is normal.     Breath sounds: Normal breath sounds. No decreased breath sounds, wheezing, rhonchi or rales.  Chest:     Chest wall: No mass.  Abdominal:     Palpations: Abdomen is soft.     Tenderness: There is no abdominal tenderness.     Hernia: No hernia is present.  Musculoskeletal:     Cervical back: Normal range of motion.     Right lower leg: No edema.     Left lower  leg: No edema.  Lymphadenopathy:     Cervical: No cervical adenopathy.     Upper Body:     Right upper body: No supraclavicular adenopathy.     Left upper body: No supraclavicular adenopathy.  Skin:    General: Skin is warm and dry.  Neurological:     General: No focal deficit present.     Mental Status: He is alert and oriented to person, place, and time. Mental status is at baseline.     Cranial Nerves: Cranial nerves 2-12 are intact.     Sensory: Sensation is intact.     Motor: Motor function is intact.     Coordination: Coordination is intact.     Gait: Gait is intact.     Deep Tendon Reflexes: Reflexes are normal and symmetric.  Psychiatric:        Attention and Perception: Attention normal.        Mood and Affect: Mood normal.        Speech: Speech normal.         Behavior: Behavior normal. Behavior is cooperative.        Thought Content: Thought content normal.        Cognition and Memory: Cognition and memory normal.        Judgment: Judgment normal.       Results:   Studies obtained and personally reviewed by me:  Imaging, colonoscopy, mammogram, bone density scan, echocardiogram, heart cath, stress test, CT calcium score, etc. ***   Labs:       Component Value Date/Time   NA 141 04/18/2021 0947   K 4.8 04/18/2021 0947   CL 104 04/18/2021 0947   CO2 32 04/18/2021 0947   GLUCOSE 87 04/18/2021 0947   BUN 8 04/18/2021 0947   CREATININE 0.98 04/18/2021 0947   CALCIUM 9.8 04/18/2021 0947   PROT 6.8 04/18/2021 0947   ALBUMIN 4.8 08/18/2019 1400   AST 12 04/18/2021 0947   ALT 14 04/18/2021 0947   ALKPHOS 95 08/18/2019 1400   BILITOT 0.6 04/18/2021 0947   GFRNONAA >60 08/18/2019 1400   GFRAA >60 08/18/2019 1400     Lab Results  Component Value Date   WBC 5.4 04/18/2021   HGB 15.6 04/18/2021   HCT 47.3 04/18/2021   MCV 95.4 04/18/2021   PLT 293 04/18/2021    Lab Results  Component Value Date   CHOL 165 04/18/2021   HDL 48 04/18/2021   LDLCALC 94 04/18/2021   TRIG 130 04/18/2021   CHOLHDL 3.4 04/18/2021    No results found for: "HGBA1C"   No results found for: "TSH"   Lab Results  Component Value Date   PSA 0.71 04/18/2021   *** delete for male pts  ***    Assessment & Plan:   Labs reviewed including CBC PSA lipid panel and c-Met are within normal limits. He has not had screening colonoscopy and order will be placed.  Plan: Recommend returning in 1 year or as needed. Order placed for screening colonoscopy.     I,Alexander Ruley,acting as a Education administrator for Elby Showers, MD.,have documented all relevant documentation on the behalf of Elby Showers, MD,as directed by  Elby Showers, MD while in the presence of Elby Showers, MD.   ***

## 2022-04-21 ENCOUNTER — Encounter: Payer: Self-pay | Admitting: Internal Medicine

## 2022-04-22 ENCOUNTER — Encounter: Payer: Self-pay | Admitting: Internal Medicine

## 2022-04-22 NOTE — Telephone Encounter (Signed)
Mailed dismissal letter
